# Patient Record
Sex: Male | Born: 1955 | Race: White | Hispanic: No | Marital: Married | Smoking: Current some day smoker
Health system: Southern US, Community
[De-identification: ages and names within clinical notes are randomized; demographics above are authoritative.]

## PROBLEM LIST (undated history)

## (undated) DIAGNOSIS — E669 Obesity, unspecified: Secondary | ICD-10-CM

## (undated) DIAGNOSIS — E785 Hyperlipidemia, unspecified: Secondary | ICD-10-CM

## (undated) DIAGNOSIS — Z8249 Family history of ischemic heart disease and other diseases of the circulatory system: Secondary | ICD-10-CM

## (undated) DIAGNOSIS — M1990 Unspecified osteoarthritis, unspecified site: Secondary | ICD-10-CM

## (undated) DIAGNOSIS — I10 Essential (primary) hypertension: Secondary | ICD-10-CM

## (undated) HISTORY — DX: Obesity, unspecified: E66.9

## (undated) HISTORY — DX: Family history of ischemic heart disease and other diseases of the circulatory system: Z82.49

## (undated) HISTORY — PX: COLONOSCOPY: SHX174

## (undated) HISTORY — DX: Essential (primary) hypertension: I10

## (undated) HISTORY — DX: Unspecified osteoarthritis, unspecified site: M19.90

## (undated) HISTORY — DX: Hyperlipidemia, unspecified: E78.5

## (undated) HISTORY — PX: KNEE SURGERY: SHX244

---

## 1988-09-13 HISTORY — PX: VASECTOMY: SHX75

## 2002-11-30 ENCOUNTER — Ambulatory Visit (HOSPITAL_COMMUNITY): Admission: RE | Admit: 2002-11-30 | Discharge: 2002-11-30 | Payer: Self-pay | Admitting: Family Medicine

## 2002-11-30 ENCOUNTER — Encounter: Payer: Self-pay | Admitting: Family Medicine

## 2002-12-13 ENCOUNTER — Ambulatory Visit (HOSPITAL_COMMUNITY): Admission: RE | Admit: 2002-12-13 | Discharge: 2002-12-13 | Payer: Self-pay | Admitting: Family Medicine

## 2002-12-13 ENCOUNTER — Encounter: Payer: Self-pay | Admitting: Family Medicine

## 2002-12-24 ENCOUNTER — Encounter: Payer: Self-pay | Admitting: Family Medicine

## 2002-12-27 ENCOUNTER — Ambulatory Visit (HOSPITAL_COMMUNITY): Admission: RE | Admit: 2002-12-27 | Discharge: 2002-12-27 | Payer: Self-pay | Admitting: Family Medicine

## 2003-12-19 ENCOUNTER — Ambulatory Visit (HOSPITAL_COMMUNITY): Admission: RE | Admit: 2003-12-19 | Discharge: 2003-12-19 | Payer: Self-pay | Admitting: Family Medicine

## 2004-01-29 ENCOUNTER — Ambulatory Visit (HOSPITAL_COMMUNITY): Admission: RE | Admit: 2004-01-29 | Discharge: 2004-01-29 | Payer: Self-pay | Admitting: Internal Medicine

## 2006-04-25 ENCOUNTER — Ambulatory Visit (HOSPITAL_COMMUNITY): Admission: RE | Admit: 2006-04-25 | Discharge: 2006-04-25 | Payer: Self-pay | Admitting: Family Medicine

## 2006-06-16 ENCOUNTER — Ambulatory Visit: Payer: Self-pay | Admitting: Orthopedic Surgery

## 2006-07-14 ENCOUNTER — Ambulatory Visit: Payer: Self-pay | Admitting: Orthopedic Surgery

## 2007-02-27 ENCOUNTER — Ambulatory Visit: Payer: Self-pay | Admitting: Orthopedic Surgery

## 2007-03-10 ENCOUNTER — Ambulatory Visit: Payer: Self-pay | Admitting: Orthopedic Surgery

## 2007-03-10 ENCOUNTER — Ambulatory Visit (HOSPITAL_COMMUNITY): Admission: RE | Admit: 2007-03-10 | Discharge: 2007-03-10 | Payer: Self-pay | Admitting: Orthopedic Surgery

## 2007-03-13 ENCOUNTER — Ambulatory Visit: Payer: Self-pay | Admitting: Orthopedic Surgery

## 2007-03-13 ENCOUNTER — Encounter (HOSPITAL_COMMUNITY): Admission: RE | Admit: 2007-03-13 | Discharge: 2007-04-12 | Payer: Self-pay | Admitting: Orthopedic Surgery

## 2007-03-30 ENCOUNTER — Ambulatory Visit: Payer: Self-pay | Admitting: Orthopedic Surgery

## 2007-04-26 ENCOUNTER — Ambulatory Visit: Payer: Self-pay | Admitting: Orthopedic Surgery

## 2007-08-14 ENCOUNTER — Ambulatory Visit: Payer: Self-pay | Admitting: Orthopedic Surgery

## 2007-08-14 DIAGNOSIS — M1710 Unilateral primary osteoarthritis, unspecified knee: Secondary | ICD-10-CM | POA: Insufficient documentation

## 2007-08-14 DIAGNOSIS — IMO0002 Reserved for concepts with insufficient information to code with codable children: Secondary | ICD-10-CM | POA: Insufficient documentation

## 2007-09-04 ENCOUNTER — Telehealth: Payer: Self-pay | Admitting: Orthopedic Surgery

## 2007-09-14 HISTORY — PX: OTHER SURGICAL HISTORY: SHX169

## 2007-09-28 ENCOUNTER — Telehealth: Payer: Self-pay | Admitting: Orthopedic Surgery

## 2007-09-28 ENCOUNTER — Encounter: Payer: Self-pay | Admitting: Orthopedic Surgery

## 2007-10-01 ENCOUNTER — Encounter: Payer: Self-pay | Admitting: Orthopedic Surgery

## 2007-10-10 ENCOUNTER — Encounter: Payer: Self-pay | Admitting: Orthopedic Surgery

## 2007-10-23 IMAGING — CR DG CHEST 2V
2 series · 2 of 2 positions shown · non-contrast
Comparison: none

CLINICAL DATA: 51-year-old male, preop for knee replacement.  History of tobacco abuse. 
 CHEST - 2 VIEW:

[w chest pa]
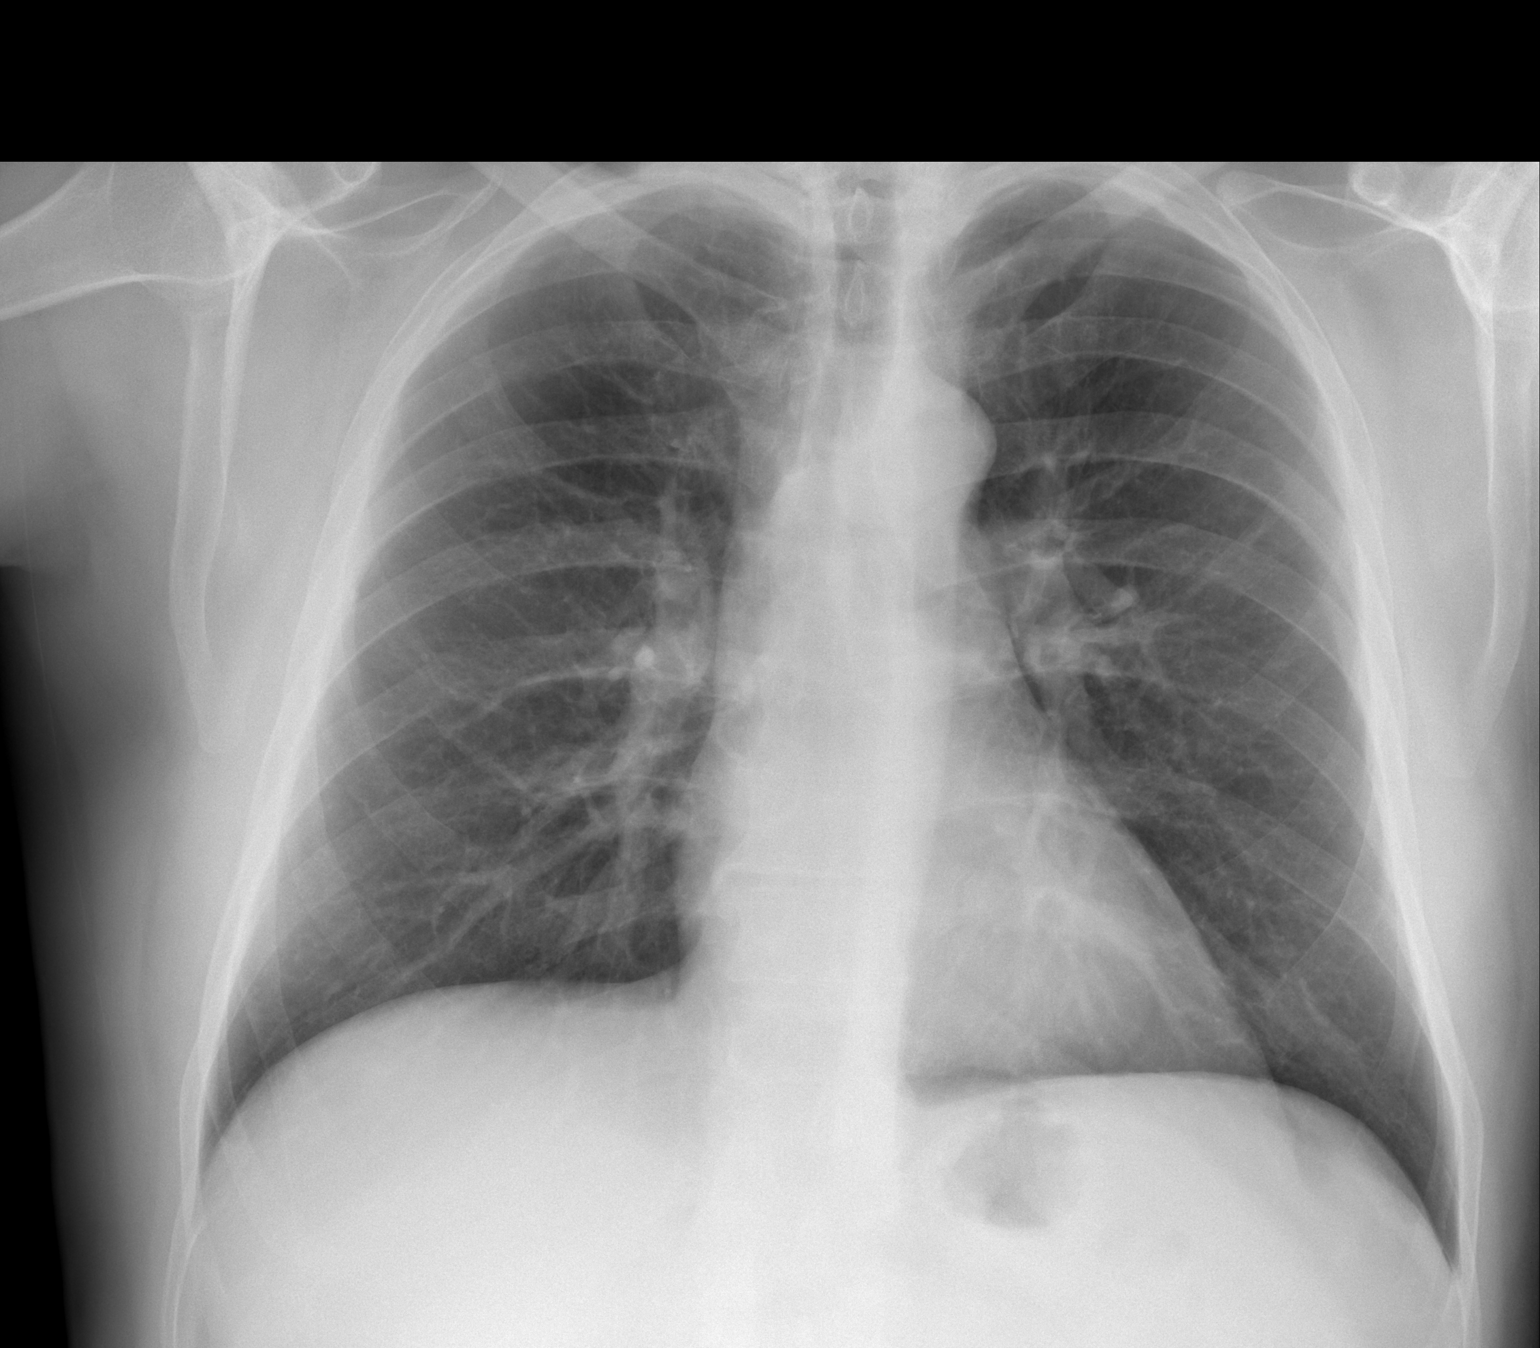

[w chest lat]
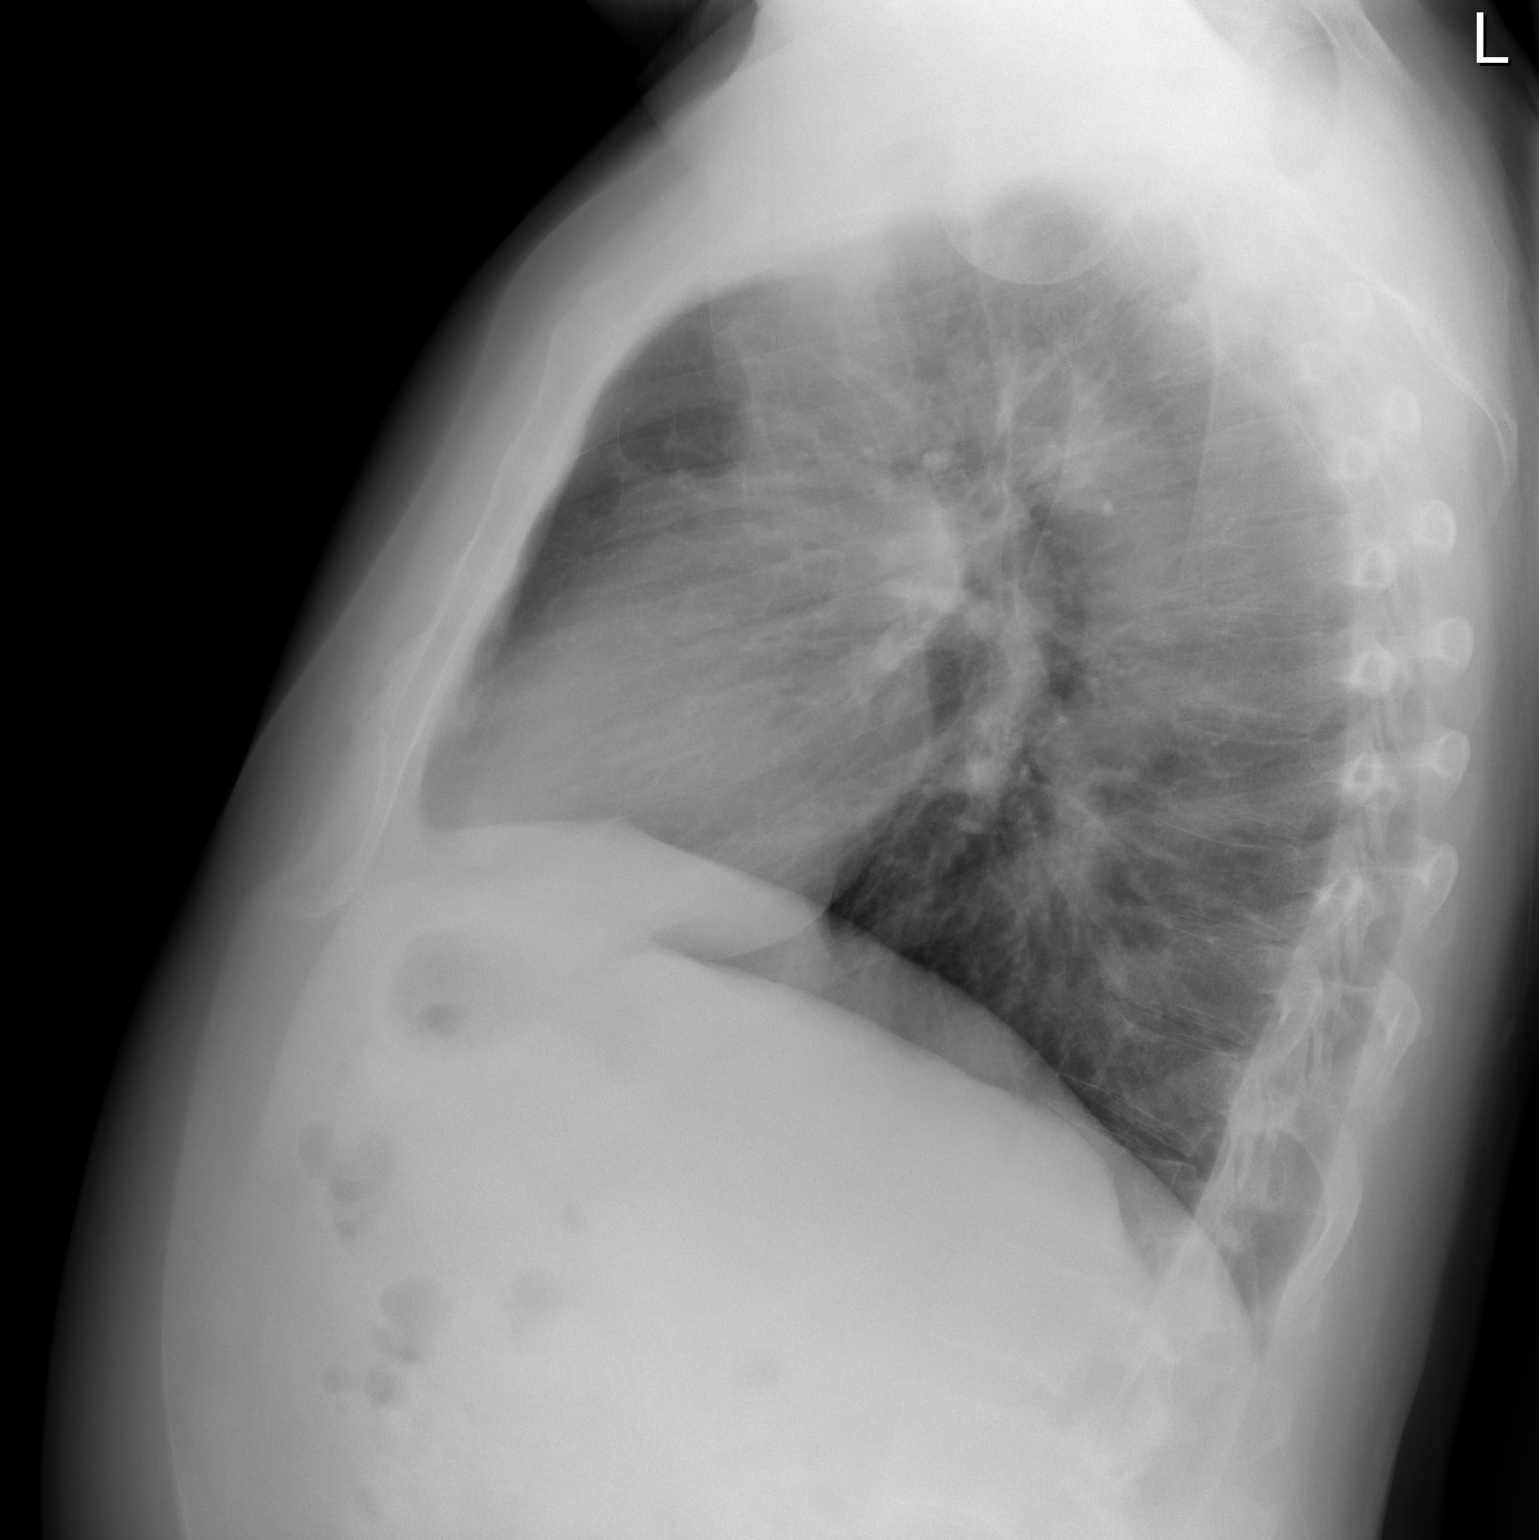

[2 of 2 positions shown; findings below may reference images not displayed]

FINDINGS: The cardiopericardial silhouette is within normal limits for size.  There is mild prominence of the pulmonary arteries, similar to the prior study.  There is slight expansion of the AP diameter of the chest.  Mild flattening of the hemidiaphragms are noted.  No focal air space disease is seen.
IMPRESSION: 1.  Mild changes of COPD.
 2.  No acute cardiopulmonary disease.

## 2007-10-31 ENCOUNTER — Observation Stay (HOSPITAL_COMMUNITY): Admission: RE | Admit: 2007-10-31 | Discharge: 2007-11-01 | Payer: Self-pay | Admitting: Orthopedic Surgery

## 2008-05-13 ENCOUNTER — Ambulatory Visit: Payer: Self-pay | Admitting: Orthopedic Surgery

## 2008-05-13 DIAGNOSIS — M7710 Lateral epicondylitis, unspecified elbow: Secondary | ICD-10-CM | POA: Insufficient documentation

## 2008-06-26 ENCOUNTER — Ambulatory Visit: Payer: Self-pay | Admitting: Orthopedic Surgery

## 2008-07-16 ENCOUNTER — Ambulatory Visit (HOSPITAL_COMMUNITY): Admission: RE | Admit: 2008-07-16 | Discharge: 2008-07-17 | Payer: Self-pay | Admitting: Orthopedic Surgery

## 2009-11-05 ENCOUNTER — Ambulatory Visit (HOSPITAL_COMMUNITY): Admission: RE | Admit: 2009-11-05 | Discharge: 2009-11-05 | Payer: Self-pay | Admitting: Family Medicine

## 2009-11-05 IMAGING — CR DG CHEST 2V
2 series · 2 of 2 positions shown · non-contrast
Comparison: PA and lateral chest 10/23/2007

CLINICAL DATA: Physical exam.  Tobacco abuse.

CHEST - 2 VIEW

[view not recorded (1 of 2)]
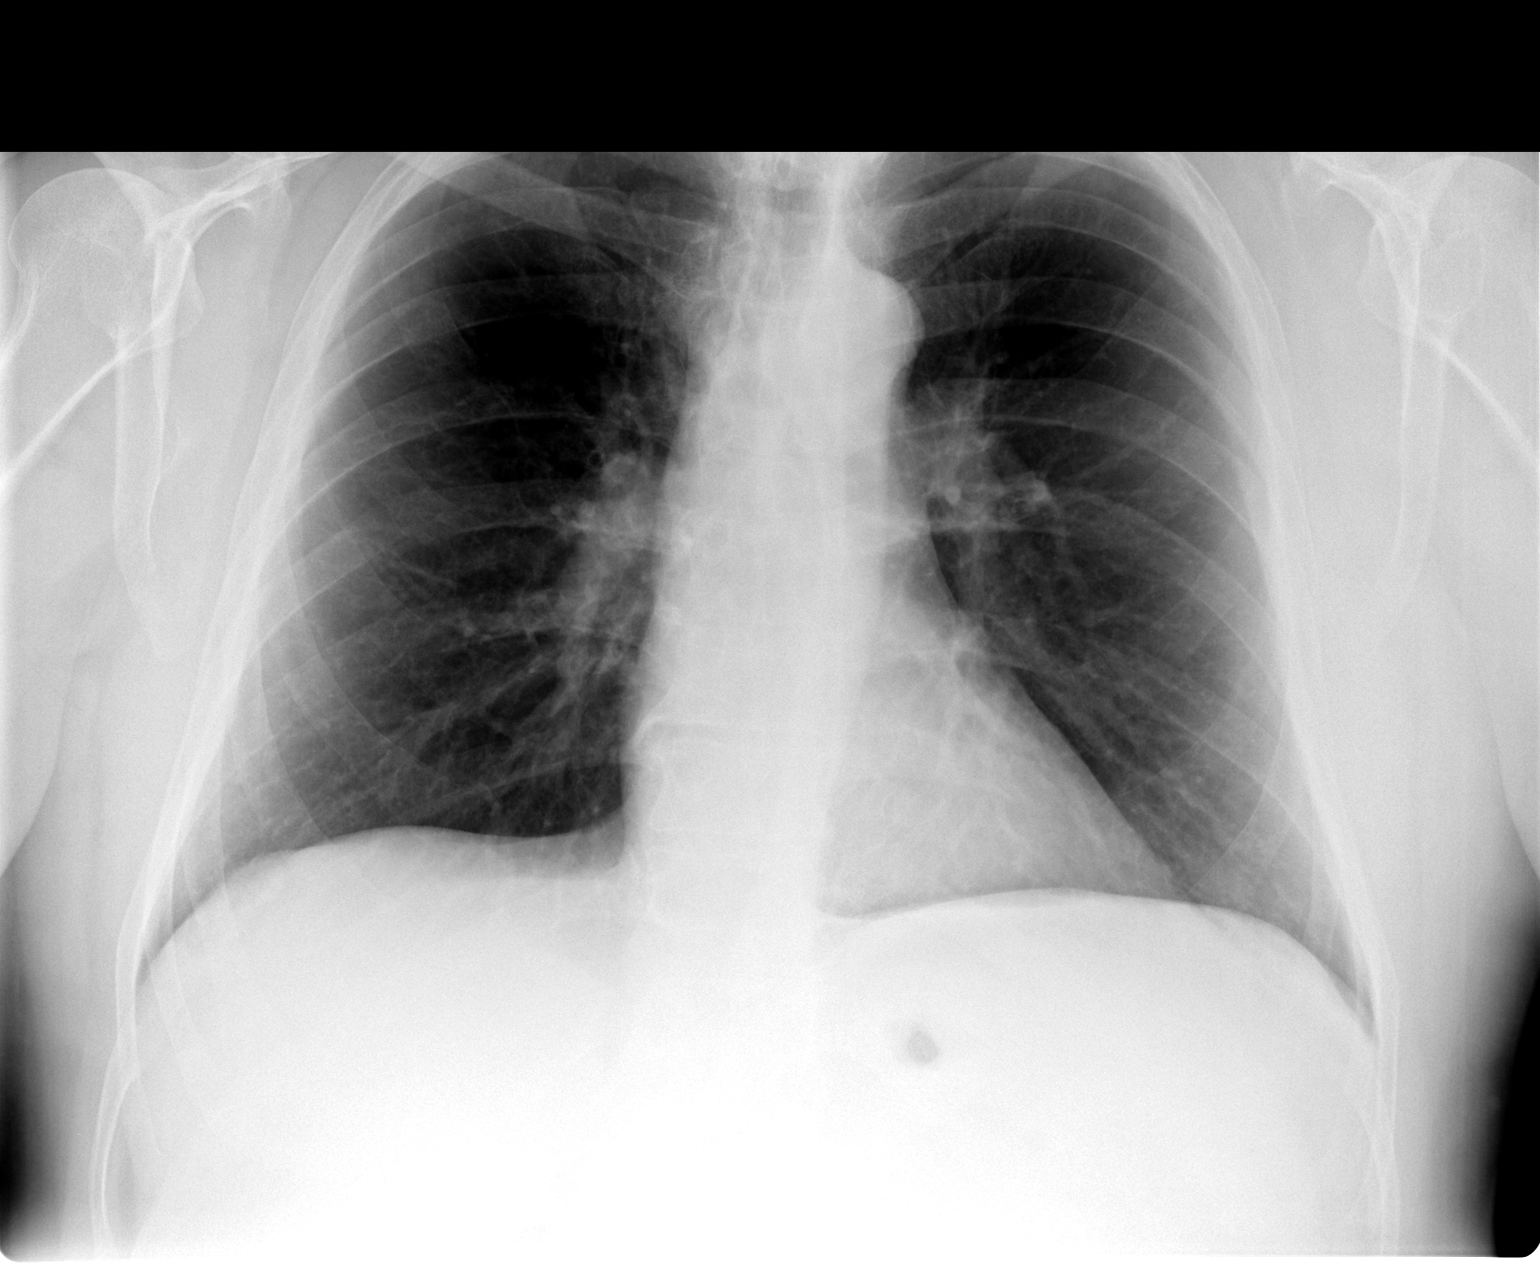

[view not recorded (2 of 2)]
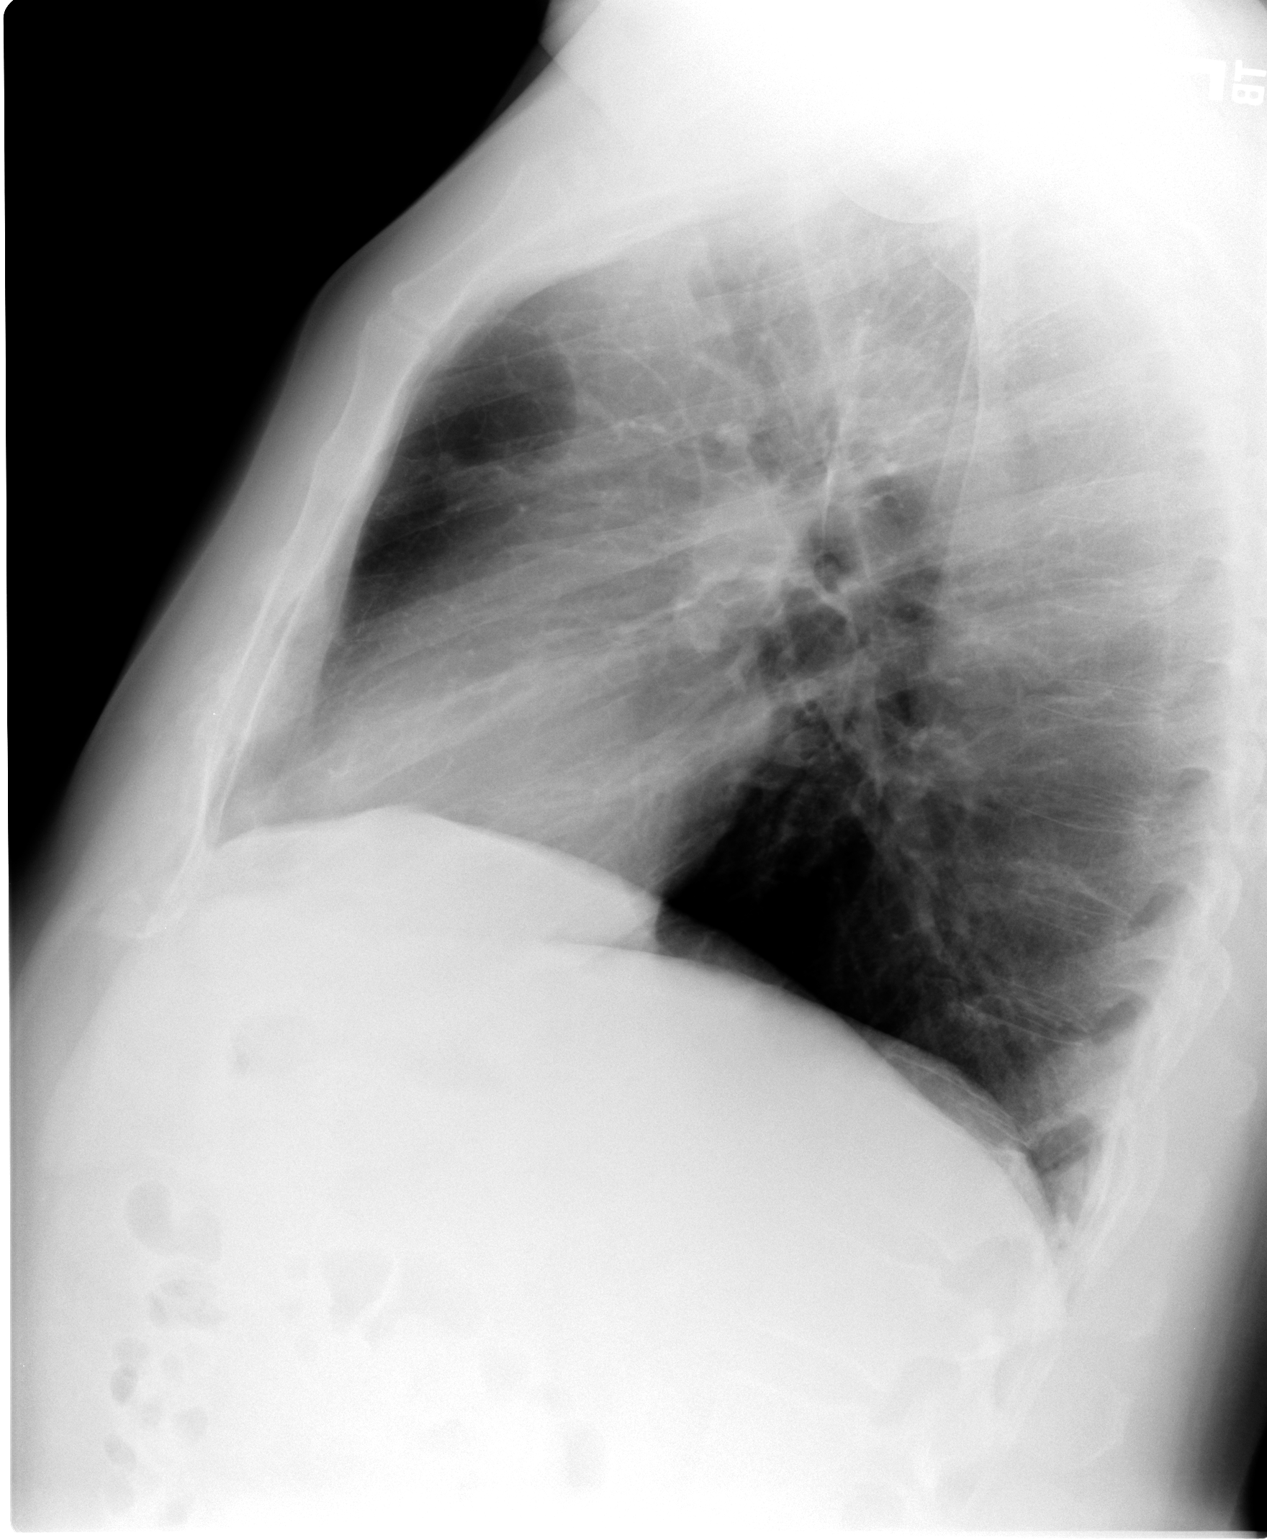

[2 of 2 positions shown; findings below may reference images not displayed]

FINDINGS: The lungs are clear.  Heart size is normal.  No pleural
effusion.  Pulmonary hyperexpansion noted.
IMPRESSION: No acute finding.  Stable compared prior exam.

## 2010-11-10 ENCOUNTER — Ambulatory Visit (HOSPITAL_COMMUNITY)
Admission: RE | Admit: 2010-11-10 | Discharge: 2010-11-10 | Disposition: A | Discharge status: Home / Self Care | Payer: 59 | Source: Ambulatory Visit | Attending: Family Medicine | Admitting: Family Medicine

## 2010-11-10 ENCOUNTER — Other Ambulatory Visit (HOSPITAL_COMMUNITY): Payer: Self-pay | Admitting: Family Medicine

## 2010-11-10 DIAGNOSIS — Z87891 Personal history of nicotine dependence: Secondary | ICD-10-CM

## 2010-11-10 DIAGNOSIS — R0600 Dyspnea, unspecified: Secondary | ICD-10-CM

## 2010-11-10 DIAGNOSIS — R0609 Other forms of dyspnea: Secondary | ICD-10-CM | POA: Insufficient documentation

## 2010-11-10 DIAGNOSIS — F17200 Nicotine dependence, unspecified, uncomplicated: Secondary | ICD-10-CM | POA: Insufficient documentation

## 2010-11-10 DIAGNOSIS — R0989 Other specified symptoms and signs involving the circulatory and respiratory systems: Secondary | ICD-10-CM | POA: Insufficient documentation

## 2010-11-10 IMAGING — CR DG CHEST 2V
2 series · 2 of 2 positions shown · non-contrast
Comparison: 11/05/2009

CLINICAL DATA: Smoker, dyspnea on exertion, shortness of breath

CHEST - 2 VIEW

[view not recorded (1 of 2)]
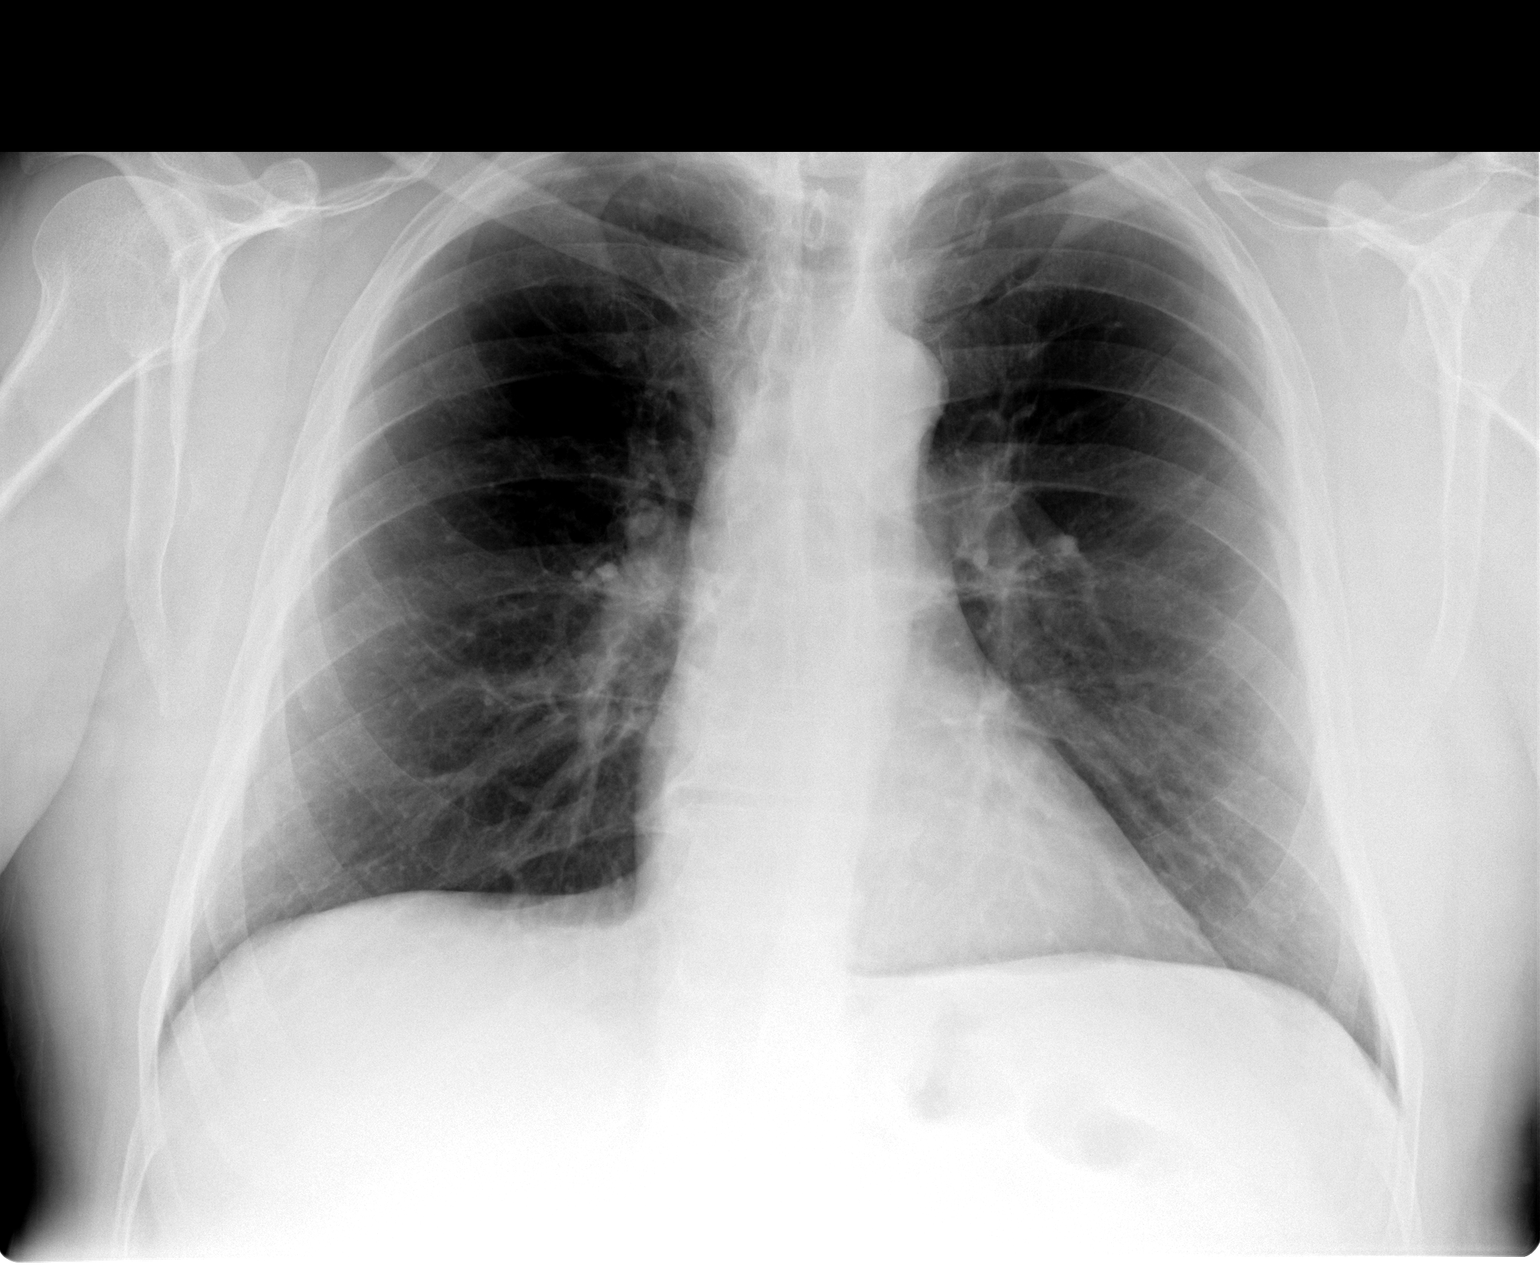

[view not recorded (2 of 2)]
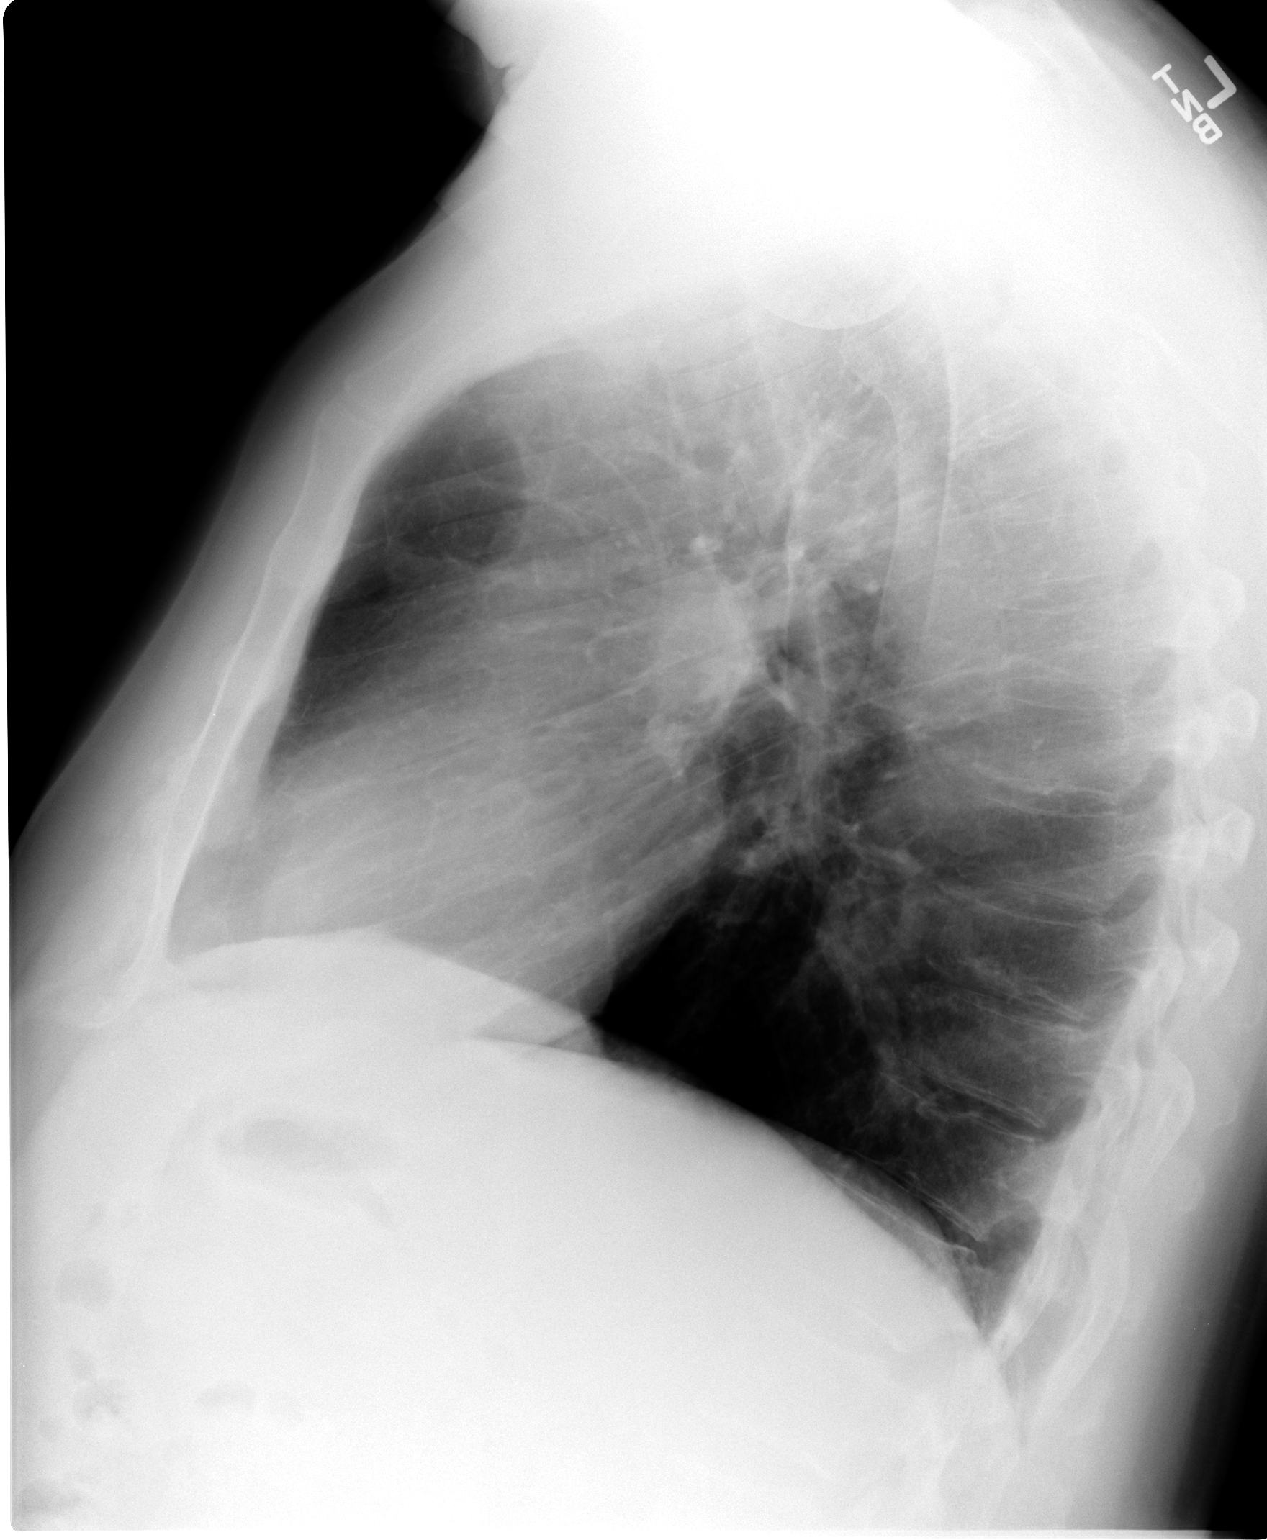

[2 of 2 positions shown; findings below may reference images not displayed]

FINDINGS: Normal heart size, mediastinal contours, and pulmonary vascularity.
Mild emphysematous changes with minimal chronic peribronchial
thickening.
No infiltrate, pleural effusion or pneumothorax.
No acute osseous findings.
IMPRESSION: Emphysematous changes.
No acute abnormalities.

## 2011-01-26 NOTE — Op Note (Signed)
[name], [name]              ACCOUNT NO.:  [id number]   MEDICAL RECORD NO.:  [id number]          PATIENT TYPE:  INP   LOCATION:  0007                         FACILITY:  [facility]   PHYSICIAN:  Madlyn Frankel. Charlann Boxer, M.D.  DATE OF BIRTH:  11/27/1955   DATE OF PROCEDURE:  07/16/2008  DATE OF DISCHARGE:                               OPERATIVE REPORT   PREOPERATIVE DIAGNOSIS:  Left knee medial compartment osteoarthritis,  history of right partial knee replacement.   POSTOPERATIVE DIAGNOSIS:  Left knee medial compartment osteoarthritis,  history of right partial knee replacement.   PROCEDURE:  Left unicompartmental or partial knee replacement using a  Biomet Oxford knee system size large femur, size D medial left tibial  tray, size 4 insert to match the large femur.   SURGEON:  Madlyn Frankel. Charlann Boxer, M.D.   ASSISTANT:  Yetta Glassman. Loreta Ave, PA-C   ANESTHESIA:  Spinal.   SPECIMENS:  None.   COMPLICATIONS:  None.   DRAINS:  Times one.   TOURNIQUET TIME:  45 minutes at 250 mmHg.   INDICATIONS OF PROCEDURE:  [name] is a 55 year old gentleman with  bilateral knee osteoarthritis predominantly with complaints medially.  He successfully underwent a right partial knee replacement and was very  pleased with that result.  He had progressive discomfort of his left  knee worsening over the past couple months with a desire at this point  to proceed with left partial knee replacement.  Risks, benefits,  hospital stay were all reviewed and consent was obtained.   PROCEDURE IN DETAIL:  The patient was brought to the operative theater.  Once adequate anesthesia was established, preoperative antibiotics  administered, the patient was positioned supine with the thigh  tourniquet placed.  The leg was then placed into the Biomet leg holder  to allow for 120 degrees of flexion.   At this point we pre scrubbed and prepped and draped the left knee para  midline.  The leg was exsanguinated.  A para midline  incision was made  from the patella to the tubercle.  Extensor mechanism was defined.  A  median arthrotomy was created to allow for patella subluxation.  Following initial exposure and debridement of synovium and fat pad  osteophytes were debrided off the medial distal femur all the way to the  anterior aspect as well as into the notch.   With this taken care of the tibia was addressed first.  Using  extramedullary guide I resected bone with a reciprocating and then  oscillating saw on the medial side of the knee.  At this point I checked  the cut surface and confirmed it was a size D again as we had done on  the contralateral knee.  The 4 feeler gauge within this medial  compartment nicely.   At this point the intramedullary canal on the femur was opened a  centimeter above the medial aspect at the notch.  Intramedullary rod  passed by hand.   The posterior cutting block guide holes were then positioned with a 3  feeler gauge in place.  Once I had confirmed it was in  the medial third  of the femur and parallel to the intramedullary rod drill holes were  made.  The posterior cutting block was then placed and the posterior cut  made.   At this point the zero spigot was placed and the distal femur milled.  A  trial reduction is now carried out with a large femur and D tibia and  the 4 insert which got in with the knee in flexion.  However, in  extension I was unable to really get the size 1 feeler gauge in place.  Given this I chose to use a 4 spigot.  The distal femur was milled  again, bone was removed on the medial and lateral aspects of the medial  femoral condyle as well as at the area I had just reamed out centrally.  I retrialed at this point with a large femur and D tibia.  At this point  the size 4 feeler gauge felt nicely from extension and flexion.   At this point trial components were removed, tibial preparation was  carried out with the tibial tray held in position and  pinned.  The slot  for the keel was then sought out with the oscillating saw.   The trial reduction was now carried out with the keeled D medial  component, the large femur and the 4 lollipop poly.  In 90 degrees of  flexion and then at 20 degrees of flexion there was a millimeter or 2  play of the poly with tension.   Given all these parameters I removed all components, drilled sclerotic  bone with the drill, irrigated the wounds and prepared cement as a  single batch single cementing technique.  Final components were opened  and the cement mixed.  Tibial component was cemented first.  The knee  was brought to 45 degrees of flexion with a trial femoral component  placed and held in position for approximately 3-4 minutes.  Excessive  cement was removed.  Following this the femoral component was cemented  in position under the same technique, removing excessive cement and  holding the knee at 45 degrees of flexion.  Once the cement had fully  cured on the back table excessive cement was removed throughout the back  of the knee.  Once I was satisfied I was unable to visualize any cement  to the soft tissues or around the prosthetic borders.  I placed the  final 4, inserted the match for the large femoral component.  We  reirrigated the knee.  Tourniquet was let down at 45 minutes.  Placed a  medium Hemovac drain deep.  We then reapproximated extensor mechanism  with the knee in flexion using #1 Vicryl.  The remaining wound was  closed with 2-0 Vicryl and running 4-0 Monocryl.  The knee was cleaned,  dried and dressed sterilely with a sterile bulky wrap.  He was brought  to the recovery room in stable condition tolerating the procedure well.      Madlyn Frankel Charlann Boxer, M.D.  Electronically Signed     MDO/MEDQ  D:  07/16/2008  T:  07/17/2008  Job:  [id number]

## 2011-01-26 NOTE — H&P (Signed)
NAMESAEL, FURCHES              ACCOUNT NO.:  [id number]   MEDICAL RECORD NO.:  [id number]         PATIENT TYPE:  LINP   LOCATION:                               FACILITY:  [facility]   PHYSICIAN:  Madlyn Frankel. Charlann Boxer, M.D.  DATE OF BIRTH:  05/15/1956   DATE OF ADMISSION:  10/31/2007  DATE OF DISCHARGE:                              HISTORY & PHYSICAL   ATTENDING PHYSICIAN:  Madlyn Frankel. Charlann Boxer, M.D.   PROCEDURE:  Right unicondylar knee replacement.   CHIEF COMPLAINT:  Right medial knee pain.   HISTORY OF PRESENT ILLNESS:  A 55 year old male with a history of right  medial knee pain secondary to medial compartment osteoarthritis.  It has  been refractory to all conservative treatment including oral anti-  inflammatories, cortisone injections.  His quality of life has been  diminished.  He has been presurgically cleared for surgery.   PAST MEDICAL HISTORY:  Significant for:  1. Osteoarthritis.  2. Hypertension.   PAST SURGICAL HISTORY:  Vasectomy and arthroscopic surgery right knee.   FAMILY HISTORY:  Diabetes, cancer.   SOCIAL HISTORY:  The patient is married.  Primary caregiver after  surgery will be his wife, Vernona Rieger.   DRUG ALLERGIES:  No known drug allergies.   MEDICATIONS:  1. Benicar 20 mg one p.o. daily.  2. Vicodin p.r.n.  3. Ibuprofen p.r.n.   REVIEW OF SYSTEMS:  See history of present illness.   PHYSICAL EXAMINATION:  VITAL SIGNS:  Pulse 64, respirations 18, blood  pressure 140/84.  GENERAL:  Awake, alert and oriented, well-developed, well-nourished, no  acute distress.  NECK:  Supple.  No carotid bruits.  CHEST/LUNGS:  Clear to auscultation bilaterally.  BREASTS:  Deferred.  HEART:  Regular rate and rhythm.  S1-S2 distinct.  ABDOMEN:  Soft, nontender, bowel sounds present.  GENITOURINARY:  Deferred.  EXTREMITIES:  Right knee with medial sided tenderness.  He does have  full extension with flexion back to 130 degrees.  SKIN:  Previous arthroscopic scars.  No sign of  cellulitis or infection.  NEUROLOGIC:  Intact distal sensibilities.   Labs, EKG, chest x-ray all pending presurgical testing.   IMPRESSION:  Right knee medial osteoarthritis.   PLAN OF ACTION:  A right unicondylar knee replacement surgery by  surgeon, Dr. Durene Romans, at [facility] on October 31, 2007.  Risks and complications were discussed.  Postoperative medications were  provided at time of history and physical, including Lovenox Robaxin,  iron, aspirin, Colace and MiraLax.  Pain medicine will be provided at  time of surgery.     ______________________________  Yetta Glassman Loreta Ave, [place]      Madlyn Frankel. Charlann Boxer, M.D.  Electronically Signed    BLM/MEDQ  D:  10/27/2007  T:  10/28/2007  Job:  [id number]   cc:   Patrica Duel, M.D.  Fax: [phone]

## 2011-01-26 NOTE — H&P (Signed)
[name], [name]              ACCOUNT NO.:  [id number]   MEDICAL RECORD NO.:  [id number]          PATIENT TYPE:  INP   LOCATION:                               FACILITY:  [facility]   PHYSICIAN:  Madlyn Frankel. Charlann Boxer, M.D.  DATE OF BIRTH:  1955/10/26   DATE OF ADMISSION:  07/16/2008  DATE OF DISCHARGE:                              HISTORY & PHYSICAL   PROCEDURE:  Left unicondylar knee replacement of the medial compartment.   CHIEF COMPLAINT:  Left knee pain.   HISTORY OF PRESENT ILLNESS:  A 55 year old male with a history of left  knee pain secondary to medial compartment osteoarthritis.  It has been  refractory to conservative treatment.  Has a history of right partial  knee replacement and did great.   PRIMARY CARE PHYSICIAN:  Dr. Patrica Duel.   PAST MEDICAL HISTORY:  1. Significant for osteoarthritis.  2. Hypertension.   PAST SURGICAL HISTORY:  1. Includes right partial knee replacement.  2. Arthroscopic surgery on the right knee as well.   FAMILY HISTORY:  Heart disease.   SOCIAL HISTORY:  Married.  Primary caregiver will be in the home after  surgery.   DRUG ALLERGIES:  No known drug allergies.   MEDICATIONS:  1. Include Benicar 20 mg 1 p.o. daily.  2. Celebrex 200 mg 1 p.o. b.i.d. x2 weeks after surgery.   REVIEW OF SYSTEMS:  See HPI.   PHYSICAL EXAMINATION:  VITAL SIGNS:  Pulse 72, respirations 18, blood  pressure 124/78.  GENERAL:  Awake, alert, and oriented.  Well developed, well nourished,  in no acute distress.  NECK:  Supple.  No carotid bruits.  CHEST:  Lungs clear to auscultation bilaterally.  BREASTS:  Deferred.  HEART:  Regular rate and rhythm.  S1, S2 distinct.  ABDOMEN:  Soft, nontender, nondistended.  Bowel sounds present.  GENITOURINARY:  Deferred.  EXTREMITIES:  Left medial knee osteoarthritis with medial side  tenderness.  SKIN:  No cellulitis.  NEUROLOGIC:  Intact distal sensibilities.   LABORATORY DATA:  Labs, EKG, chest x-ray all pending.   Presurgical  testing.   IMPRESSION:  Left medial compartment osteoarthritis of the left knee.   PLAN OF ACTION:  Left unicondylar knee replacement [facility]  July 16, 2008 by Dr. Durene Romans.  Risks and complications were  discussed.   POSTOPERATIVE MEDICATIONS:  Include Lovenox, Robaxin, iron, aspirin,  Colace, Miralax, provided, as well as Celebrex will be utilized  perioperatively.     ______________________________  Yetta Glassman Loreta Ave, [place]      Madlyn Frankel. Charlann Boxer, M.D.  Electronically Signed    BLM/MEDQ  D:  07/10/2008  T:  07/10/2008  Job:  [id number]   cc:   Patrica Duel, M.D.  Fax: [phone]

## 2011-01-26 NOTE — H&P (Signed)
[name], [name]              ACCOUNT NO.:  [id number]   MEDICAL RECORD NO.:  [id number]          PATIENT TYPE:  AMB   LOCATION:                                FACILITY:  [facility]   PHYSICIAN:  Vickki Hearing, M.D.DATE OF BIRTH:  26-Sep-1955   DATE OF ADMISSION:  DATE OF DISCHARGE:  LH                              HISTORY & PHYSICAL   CHIEF COMPLAINT:  Right knee pain.   HISTORY:  This is a 55 year old male. He has had pain in his right knee  for 6 months, no injury.  No previous problems.  He reports pain in the  medial aspect of the right knee, difficulty getting in and out of a  chair. He has tried ibuprofen, Celebrex, Tylenol without any relief.  He  specifically indicates that he has pain when he twists or turns on the  knee and that it is all medial. He comes in for evaluation.   I have seen him in the past for other orthopedic related problems for  his heel. He has recently been seen by Dr. Nobie Putnam for his physical. He  was put on some blood pressure medicine, Benicar 20 mg a day.  That  seems to have controlled his blood pressure. He has a negative review of  systems except for joint problems.  He has no major medical problems  other than hypertension.  He has had a vasectomy. His  medication was  listed. He is also given some Vicodin ES for pain during the interval  until his surgery. He has a family history of arthritis and diabetes.   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  He does not smoke since  February 2007.  He has a couple beers a day. He has a Theme park manager, he is an Airline pilot.   VITAL SIGNS:  He weighs approximately 210 pounds, pulse 66, respiratory  rate is 18.  GENERAL: He is well-developed and nourished, grooming and hygiene are  normal.  CARDIOVASCULAR:  Shows normal pulse and perfusion, no swelling.  LYMPH:  Negative for lymphadenopathy and lymphangitis.  SKIN:  Normal x4 extremities.  Skin normal head and neck.  NEURO:  Normal  sensation, reflexes, coordination. He is awake, alert and  oriented x3.  MUSCULOSKELETAL:  Upper extremities normal range of motion, strength,  stability, alignment. Right knee medial joint line tenderness, small  joint effusion, full strength and range of motion but painful range of  motion 120-135. Painful hyperextension test, painful McMurray's sign,  ligaments stable.   X-rays taken today show medial compartment compromise.   IMPRESSION:  1. Torn medial meniscus of the right knee.  2. Osteoarthritis medial compartment, right knee.  3. Pain right knee.   The informed consent process was completed. See chart records from the  office. We will do a right knee arthroscopy.  Our plan is to evaluate  his articular cartilage of his other part of his knee to see if there is  any other arthritis. If he turns out to not have a cartilage tear, he  would be a candidate for a unicompartment  replacement by Dr. Charlann Boxer in  [place], but I am very sure that he does.      Vickki Hearing, M.D.  Electronically Signed     SEH/MEDQ  D:  02/27/2007  T:  02/28/2007  Job:  [id number]

## 2011-01-26 NOTE — Op Note (Signed)
[name], [name]              ACCOUNT NO.:  [id number]   MEDICAL RECORD NO.:  [id number]          PATIENT TYPE:  INP   LOCATION:  1613                         FACILITY:  [facility]   PHYSICIAN:  Madlyn Frankel. Charlann Boxer, M.D.  DATE OF BIRTH:  Aug 26, 1956   DATE OF PROCEDURE:  10/31/2007  DATE OF DISCHARGE:                               OPERATIVE REPORT   PREOPERATIVE DIAGNOSIS:  Right knee medial compartment osteoarthritis.   POSTOPERATIVE DIAGNOSIS:  Right knee medial compartment osteoarthritis.   PROCEDURE:  Right unicompartmental partial knee replacement.  Using a  Biomet Oxford knee system, size Large right femur, a D medial right  tibia, and a 5-mm insert.   SURGEON:  Madlyn Frankel. Charlann Boxer, M.D.   ASSISTANT:  Yetta Glassman. Mann, PA   ANESTHESIA:  Spinal.   COMPLICATIONS:  None.   TOURNIQUET TIME:  52 minutes at 250 mmHg.   BLOOD LOSS:  100 mL.   DRAINS:  One.   INDICATIONS FOR THE PROCEDURE:  [name] is a pleasant 55 year old  gentleman who is referred down for evaluation of partial knee  replacement.  He had failed conservative measures of arthroscopic  surgeries and injections, and wished to proceed with a more definitive  procedure.  I discussed the options of partial versus total knee  replacement, and based on his activity level, age and radiographs, he  was a candidate for partial knee replacement.  He wished to proceed that  way.  The risks and benefits were discussed.  Consent obtained.   PROCEDURE IN DETAIL:  The patient was brought to the operative theater.  Once adequate anesthesia and preoperative antibiotics with Ancef were  administered, the patient was positioned supine.  A proximal thigh  tourniquet was placed and the leg was positioned in a leg holder, with  it abducted and flexed to allow for 120 degrees of flexion of the knee  joint.  The knee was then prescrubbed and prepped and draped in a  sterile fashion; and then draped over the leg holder.  The leg was  exsanguinated, the tourniquet elevated to 250 mmHg.  A para midline  incision was made, followed by limited medial arthrotomy.  Fat pad and  synovium were debrided to allow for exposure.  Following initial  exposure, I was able to remove osteophytes along the medial, distal and  anterior femur.  Following this initial debridement, I placed an  extramedullary guide and resected bone off the proximal tibia in a  perpendicular plane using extramedullary device, making sure that I  remained parallel to the tibia in both planes.  Following this cut, I  excised the cut surface to be a size D for the medial side.  With this D  in place, I was able to pass the #4 feeler gauge easily at 90 degrees of  flexion, with a good finger pull; indicating that the cut was adequate.  At this point I removed the extramedullary device.  A hand awl was then  used to create a hole into the femoral canal 1 cm above the insertion of  the PCL.  I then passed the  intramedullary rod for the femur.  At this  point with a size D tibial tray in place and the #3 feeler gauge, I  placed the femoral cutting jig against it.  Once I had it in the correct  orientation and all planes per technique, I drilled it into position and  created the holes for the posterior cutting guide.  At this point all  devices were removed, and the posterior cutting jig placed onto the  distal femur.  The posterior femur cut was then made without difficulty.  This allowed me now to remove the remaining meniscus out of the medial  aspect of the knee.   At this point, a trial reduction was carried out.  I placed a 0 spigot  and then milled off the distal femur.  At this point I placed a trial  Large femur and D tibia.  At this point, I was happy with the 4 or 5 in  90 degrees of flexion; more leading towards the 5.  In extension I was  able to get the #2 feeler block at 20 degrees of extension.  At this  point used a #3 spigot, placed it on the  distal femur and then milled.  I removed excessive bone using osteotome and rongeur.  At this point a  retrial with a Large femur, D tibia, and a 5 insert; and was happy with  flexion extension balancing from 20 degrees extension to 90 degrees of  flexion without retractors in place.  At this point, all trials were  removed.  The final preparation for the tibia was carried out, holding  the size D tibia in position and then pinned it in spot and then used a  reciprocating saw to create the trough for the keel.  I then cleaned out  the trough for the instruments provided.  A trial reduction was carried  out with the final components, again with a D medial tibia, Large femur,  and the standard trials with a size 5.  At 90 degrees of flexion without  retractors, I was able to have a millimeter or so of play with the  trial; in addition this was a similar finding at 20 degrees of  extension.  At this point, all trials were removed.  I drilled holes in  the distal femur sclerotic bone to allow for cemented and digitation.  The final components were opened and cement was mixed in 2 batches.  Once I irrigated the knee out, I injected the synovial capsule layer  with 0.25% Marcaine with epinephrine and 1 mL of Toradol.  The cement  was mixed first for the tibia.  The trial femur was placed, and then the  #5 feeler gauge was placed with the knee in 45 degrees of flexion.  After 5 minutes into this, the second batch of cement was mixed.  The  femur was then cemented into position with the same technique carried  out.  Excessive cement was removed.  Once I was satisfied there was no  remaining cement, I re-irrigated the wound.  The final 5 insert was  placed.  This snapped into place with a good sound and good pressure.   At this point, we re-irrigated the wound and placed a medium Hemovac out  laterally from the proximal aspect of the knee.  The extensor mechanism  was then reapproximated using #1  Vicryl with the knee in flexion.  The  remaining wound was closed 2-0 Vicryl and a running  4-0 Monocryl.  The  knee was cleaned, dried and dressed sterilely with a sterile bulky  dressing.  He was brought to the recovery room in stable condition,  having tolerated the procedure well.      Madlyn Frankel Charlann Boxer, M.D.  Electronically Signed     MDO/MEDQ  D:  10/31/2007  T:  11/01/2007  Job:  [id number]

## 2011-01-26 NOTE — Op Note (Signed)
[name]              ACCOUNT NO.:  [id number]   MEDICAL RECORD NO.:  [id number]          PATIENT TYPE:  AMB   LOCATION:  DAY                           FACILITY:  [facility]   PHYSICIAN:  Vickki Hearing, M.D.DATE OF BIRTH:  January 10, 1956   DATE OF PROCEDURE:  03/10/2007  DATE OF DISCHARGE:                               OPERATIVE REPORT   HISTORY AND INDICATIONS:  He is a 55 year old male.  He had pain in his  right knee for 6 months, no injury.  No previous problems.  Pain is  reported on the medial aspect of the knee.  Has difficulty getting out  of the chair.  He tried ibuprofen, Celebrex, and Tylenol and did not get  any relief and noticed specific symptoms when he turned to twisted or  pivoted on the knee.  He came in for evaluation.  I did not think that  he needed MRI because his physical findings were so prominent, and we  talked about possible procedures, including arthroscopy,  unicompartmental knee replacement, other nonoperative measures, and we  both decided on surgical treatment.   PREOPERATIVE DIAGNOSIS:  Osteoarthritis and torn medial meniscus of the  right knee.   POSTOPERATIVE DIAGNOSIS:  Osteoarthritis and torn medial meniscus of the  right knee.   PROCEDURE:  Arthroscopy, partial medial meniscectomy, and microfracture  of the medial femoral condyle, right knee.   SURGEON:  Vickki Hearing, M.D.   ASSISTANT:  None.   ANESTHESIA:  General.   OPERATIVE FINDINGS:  The patient had a torn posterior horn of the medial  meniscus.  He had grade 4 medial femoral condyle lesion.  He had a  fissure in his patella; otherwise, ACL and lateral compartments were  intact.  Ligaments were stable.   DESCRIPTION OF PROCEDURE:  The procedure was done as follow follows.  [name] was identified in the preoperative holding area.  He has  marked his right knee as the surgical site.  I countersigned it.  We  then updated his history and physical.  He was  taken to the operating  room for general anesthetic.  He had a sterile prep and drape.  Timeout  procedure completed.   Diagnostic arthroscopy performed through a lateral portal.  Medial  portal added, and a probe was placed in the portal.  The knee was noted  to have a torn medial meniscus.  This was treated with trimming with the  duckbill forceps and balancing with the motorized shaver and a 50-degree  ArthroCare wand.  We then addressed the medial femoral condyle lesion.  We reassessed it.  It was a grade 4.  It was a large lesion involving  most of his weightbearing surface.  We then performed a microfracture  using the chondral picks.  We removed any loose debris and cartilage in  the knee and then irrigated the knee and injected it with 10 mL  additional Marcaine for a total of 30 and then closed with the portals  with Steri-Strips.   Second procedure:  The patient asked for injection in his right heel  prior to  going back for surgery.  We consented him for this.  Prior to  his extubation, he was given 40 mg Depo-Medrol shot in the right heel  for plantar fasciitis.  Tolerated well.  No complications. Band-Aid  applied over the wound.  Technique used was sterile.   After dressings were applied and Cryo/Cuff, the patient was extubated  and taken to the recovery room in stable condition.   POSTOPERATIVE PLAN:  Nonweightbearing 3 weeks with crutches, immediate  range of motion, weightbearing in a brace for an additional 3 weeks,  i.e., weeks 4 through 6 and then progressive weightbearing as tolerated  and exercises and strengthening.      Vickki Hearing, M.D.  Electronically Signed     SEH/MEDQ  D:  03/10/2007  T:  03/10/2007  Job:  [id number]

## 2011-01-29 NOTE — Discharge Summary (Signed)
[name], [name]              ACCOUNT NO.:  [id number]   MEDICAL RECORD NO.:  [id number]          PATIENT TYPE:  OBV   LOCATION:  1613                         FACILITY:  [facility]   PHYSICIAN:  Madlyn Frankel. Charlann Boxer, M.D.  DATE OF BIRTH:  Mar 01, 1956   DATE OF ADMISSION:  10/31/2007  DATE OF DISCHARGE:  11/01/2007                               DISCHARGE SUMMARY   ADMISSION DIAGNOSES:  1. Osteoarthritis.  2. Hypertension.   DISCHARGE DIAGNOSES:  1. Osteoarthritis.  2. Hypertension.   HISTORY OF PRESENT ILLNESS:  This 55 year old male with a history of  right medial knee pain, secondary to medial compartment osteoarthritis.  Refractory to all conservative treatment.  He was presurgically assessed  for a partial knee replacement.   CONSULTATIONS:  None.   PROCEDURE:  A right partial unicondylar knee replacement with surgeon  Dr. Madlyn Frankel. Charlann Boxer and Radio producer L. Mann, P.A.-C.   LABORATORY DATA:  Admission CBC:  Hematocrit 38.6, at discharge 34.  White cells normal.  Coags negative and normal.  Routine chemistry  showed a little bit elevated glucose at 113 on admission.  All others  were normal.  At discharge the glucose was 114.  All others normal.  Urinalysis was negative.   RADIOLOGY:  A chest two views showed mild changes of chronic obstructive  pulmonary disease.  No acute cardiopulmonary disease.   Electrocardiogram:  No findings.   HOSPITAL COURSE:  The patient underwent a right partial knee replacement  on October 31, 2007.  He was admitted to the orthopedic floor.  He was  seen on November 01, 2007, doing well, asymptomatic and hemodynamically  stable.  The Hemovac was pulled.  He was neurovascularly intact.  Positive straight leg raise.  Dependent upon weightbearing with PT.  He  wanted to go home today.  The heparin lock to his IV.  He did great with  physical therapy and wanted to go home on the evening of November 01, 2007.  The pain was well-controlled.   DISPOSITION:  Discharged home with home health care and PT, stable, and  in an improved condition.   DIET:  Regular diet.   WOUND CARE:  Keep dry.   DISCHARGE INSTRUCTIONS:  Discharge physical therapy, weightbearing as  tolerated with a rolling walker, crutches, a single-point cane.   DISCHARGE MEDICATIONS:  1. Lovenox.  2. Robaxin.  3. Aspirin.  4. Iron.  5. Colace.  6. MiraLax.  7. Vicodin 5/325 mg, one or two p.o. q.4-6h. p.r.n. pain.  8. Benicar 20 mg, one p.o. daily.   FOLLOWUP:  Follow up with Dr. Charlann Boxer at [phone] in two weeks.     ______________________________  Yetta Glassman [address], [place]      Madlyn Frankel. Charlann Boxer, M.D.  Electronically Signed    BLM/MEDQ  D:  12/12/2007  T:  12/12/2007  Job:  [id number]   cc:   Patrica Duel, M.D.  Fax: [phone]

## 2011-01-29 NOTE — Consult Note (Signed)
NAME:  [name], [name]                          ACCOUNT NO.:  [id number]   MEDICAL RECORD NO.:  [id number]                  PATIENT TYPE:   LOCATION:                                       FACILITY:   PHYSICIAN:  Lionel December, M.D.                 DATE OF BIRTH:   DATE OF CONSULTATION:  01/16/2004  DATE OF DISCHARGE:                                   CONSULTATION   ADDENDUM   PAST SURGICAL HISTORY:  Vasectomy 11 years ago.   FAMILY HISTORY:  No known family history of colorectal carcinoma, liver, or  other chronic GI problems..  Mother, age 39, is alive and relatively  healthy.  Father, deceased, at age 45 secondary to complications of diabetes  mellitus. He has 2 healthy sisters and 1 healthy brother.   SOCIAL HISTORY:  [name] is married for 15 years.  He has 2 healthy  children.  He is employed full time with [employer].  He currently  reports about one-third pack per day of cigarette use, intermittently over  the last 30 years.  He does consume 1-2 beers a day for the last 30 years.  He denies any drug use.   REVIEW OF SYSTEMS:  CONSTITUTIONAL:  Weight is stable.  Appetite is okay.  Denies any fever or chills.  GI:  See HPI. Denies any dysphagia or  odynophagia.  CARDIOVASCULAR:  Denies any chest pain or palpitations.  PULMONARY:  He does report occasional shortness of breath on exertion.  Denies any cough or hemoptysis.   PHYSICAL EXAMINATION:  VITAL SIGNS:  Weight 202 pounds.  Height 72 inches.  Temperature 97.9, blood pressure 140/70, pulse 82.  GENERAL:  [name] is a 55 year old well developed, well nourished,  Caucasian male in no acute distress.  He is alert, oriented, pleasant and  cooperative.  HEENT:  Sclerae are clear.  Nonicteric.  Conjunctivae pink. Oropharynx pink  and moist without any lesions.  NECK:  Supple without any masses or thyromegaly.  HEART:  Regular rate and rhythm with a normal S1 and S2 without any murmurs  clicks, rubs or  gallops.  ABDOMEN:  Positive bowel sounds x4.  No bruits auscultated.  Soft,  nontender, nondistended with no palpable mass or hepatosplenomegaly.  No  rebound tenderness or guarding.  RECTAL:  Exam reveals 2-3 cm, large external hemorrhoids which do not appear  to be actively thrombosed or bleeding.  Good sphincter tone.  Small amount  of formed, light brown, Hemoccult-negative stool was obtained from the vault  and no masses palpated.  The prostate is slightly enlarged, nontender.   ASSESSMENT:  [name] is a 55 year old Caucasian male with a 2-day  history of bright red rectal bleeding of moderate-to-large amount post  defecation; approximately 1 month ago.  Episode was self-limited and not  associated with any pain.  Given [name] age of 55, would recommend  further evaluation  at this time.  It is possible that bleeding may be due to  benign anorectal source such as hemorrhoids and given his history of  diverticulosis he is at risk for diverticular bleed as well, but would also  warrant ruling out colorectal carcinoma.   RECOMMENDATIONS:  Will schedule colonoscopy with Dr. Karilyn Cota in the near  future.  I have discussed the procedure including risks and benefits which  include, but are not limited to, bleeding, perforation, and infection and  drug reaction.  [name] agrees with this plan.  Consent will be  obtained. He is asked to hold his aspirin 3 days prior to the procedure.  Will schedule follow up pending procedures.   We would like to thank Dr. Regino Schultze for allowing [place] to participate in the  care of [name].     ________________________________________  ___________________________________________  Nicholas Lose, N.P.                  Lionel December, M.D.   KC/MEDQ  D:  01/16/2004  T:  01/16/2004  Job:  [id number]   cc:   Lionel December, M.D.  [address]  [place]  [place] [address]  Fax: [phone]   Kirk Ruths, M.D.  [address]  [place]  [place]  [address]  Fax: [phone]

## 2011-01-29 NOTE — Consult Note (Signed)
[name], [name]                          ACCOUNT NO.:  [id number]   MEDICAL RECORD NO.:  [id number]                  PATIENT TYPE:   LOCATION:                                       FACILITY:   PHYSICIAN:  Lionel December, M.D.                 DATE OF BIRTH:  10-08-1955   DATE OF CONSULTATION:  01/15/2004  DATE OF DISCHARGE:                                   CONSULTATION   GASTROINTESTINAL CONSULT   REQUESTING PHYSICIAN:  Kirk Ruths, M.D.   REASON FOR CONSULTATION:  Rectal bleeding.   HISTORY OF PRESENT ILLNESS:  [name] is a 55 year old Caucasian male who  reports self-limited episode of 2 days of bright red rectal bleeding prior  to his annual physical with Dr. Regino Schultze. He notes large amount of bright red  bleeding post defecation.  He denies any clots.  He denies any mucus.  He  denies any melena.  [name] denies any precipitating factors including  constipation or straining. He noted the bleeding on the toilet paper and in  the toilet water.  It was not mixed in the stool.  He denies any associated  abdominal pain without episode of bleeding; however, he does have occasional  bilateral lower quadrant intermittent aching.  He denies any proctalgia or  rectal pruritus.  He does have history of external hemorrhoids approximately  3 years; however, he denies any current flares of hemorrhoids.  He denies  any diarrhea.  He denies any heartburn, indigestion, reflux, nausea, or  vomiting.  Bowel movements are typically soft and brown and on a daily  basis.  He has been on aspirin 80 mg daily for the last 3 or 4 weeks;  however, this was after his episode of rectal bleeding.   CURRENT MEDICATIONS:  Aspirin 81 mg daily.   ALLERGIES:  No known drug allergies.   PAST MEDICAL HISTORY:  Prostatitis, diverticulosis, anxiety, Also of note,  he was found to have 3 tiny lesions in the left liver compatible with very  tiny cysts.  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  [name] states these were  initially found in October of 2002 and have remained stable.  Follow up CT  from   Dictation ended at this point.     ________________________________________  ___________________________________________  Nicholas Lose, N.P.                  Lionel December, M.D.   KC/MEDQ  D:  01/16/2004  T:  01/16/2004  Job:  [id number]

## 2011-01-29 NOTE — Op Note (Signed)
NAME:  [name], [name]                        ACCOUNT NO.:  [id number]   MEDICAL RECORD NO.:  [id number]                   PATIENT TYPE:  AMB   LOCATION:  DAY                                  FACILITY:  [facility]   PHYSICIAN:  Lionel December, M.D.                 DATE OF BIRTH:  [birth year]   DATE OF PROCEDURE:  01/29/2004  DATE OF DISCHARGE:                                 OPERATIVE REPORT   PROCEDURE:  Total colonoscopy.   INDICATIONS FOR PROCEDURE:  [name] is a 55 year old Caucasian male who has had  a few episodes of hematochezia.  He is therefore undergoing diagnostic  colonoscopy.  The procedure and risks were reviewed with the patient, and  informed consent was obtained.   PREOPERATIVE MEDICATIONS:  Demerol 50 mg IV, Versed 6 mg IV in divided dose.   FINDINGS:  The procedure was performed in the endoscopy suite.  The  patient's vital signs and O2 saturations were monitored during the procedure  and remained stable.  The patient was placed in the left lateral recumbent  position, and the Olympus videoscope was placed into the rectum and advanced  into the region of the sigmoid colon and beyond.  A few tiny diverticula  were noted in the sigmoid colon.  The preparation was excellent.  The scope  was passed to the cecum which was identified by the appendiceal orifice and  ileocecal valve.  A short segment of  TI was also examined and was normal.  As the scope was withdrawn, the colonic mucosa was once again carefully  examined and was normal throughout.  The rectal mucosa similarly was normal.  The scope was retroflexed to examine the anorectal junction, and small to  moderate-sized external hemorrhoids were noted below the dentate line.  The  endoscope was straightened and withdrawn.  The patient tolerated the  procedure well.   FINAL DIAGNOSES:  External hemorrhoids.  Otherwise normal colonoscopy.  Normal terminal ileoscopy.   RECOMMENDATIONS:  1. High fiber diet.  2. He should  continue yearly Hemoccults and will not need another exam for     10 years unless he has new symptoms.      ___________________________________________                                            Lionel December, M.D.   NR/MEDQ  D:  01/29/2004  T:  01/29/2004  Job:  [id number]   cc:   Kirk Ruths, M.D.  [address]  [place]  [place] [address]  Fax: [phone]

## 2011-06-04 LAB — BASIC METABOLIC PANEL
BUN: 8
Calcium: 9.5
Chloride: 106
Creatinine, Ser: 0.96
GFR calc Af Amer: >60
GFR calc non Af Amer: >60
Glucose, Bld: 114 — ABNORMAL HIGH
Potassium: 4
Potassium: 3.9
Sodium: 139

## 2011-06-04 LAB — URINALYSIS, ROUTINE W REFLEX MICROSCOPIC
Glucose, UA: NEGATIVE
Ketones, ur: NEGATIVE
Nitrite: NEGATIVE
Protein, ur: NEGATIVE
pH: 7

## 2011-06-04 LAB — CBC
HCT: 38.6 — ABNORMAL LOW
HCT: 34 — ABNORMAL LOW
Hemoglobin: 13.5
MCHC: 34.6
MCV: 92.1
Platelets: 222
RBC: 4.27
RDW: 13
RDW: 13.3
WBC: 5.6

## 2011-06-04 LAB — DIFFERENTIAL
Basophils Absolute: 0
Eosinophils Relative: 2
Lymphocytes Relative: 31
Lymphs Abs: 1.7
Monocytes Absolute: 0.4
Neutro Abs: 3.3

## 2011-06-04 LAB — APTT: aPTT: 26

## 2011-06-04 LAB — TYPE AND SCREEN: ABO/RH(D): A POS

## 2011-06-15 LAB — BASIC METABOLIC PANEL
BUN: 11
CO2: 29
Calcium: 8.5
Calcium: 9.3
Creatinine, Ser: 0.82
GFR calc Af Amer: >60
GFR calc non Af Amer: >60
Glucose, Bld: 119 — ABNORMAL HIGH
Potassium: 4.4
Sodium: 138

## 2011-06-15 LAB — TYPE AND SCREEN: ABO/RH(D): A POS

## 2011-06-15 LAB — APTT: aPTT: 27

## 2011-06-15 LAB — DIFFERENTIAL
Eosinophils Absolute: 0.1
Lymphocytes Relative: 28
Lymphs Abs: 1.8
Neutro Abs: 3.9
Neutrophils Relative %: 61

## 2011-06-15 LAB — CBC
HCT: 36.4 — ABNORMAL LOW
Hemoglobin: 12.1 — ABNORMAL LOW
MCHC: 33.4
MCV: 95.1
Platelets: 252
RDW: 13.4
WBC: 6.5

## 2011-06-15 LAB — URINALYSIS, ROUTINE W REFLEX MICROSCOPIC
Hgb urine dipstick: NEGATIVE
Protein, ur: NEGATIVE
Urobilinogen, UA: 0.2

## 2011-06-15 LAB — PROTIME-INR
INR: 1
Prothrombin Time: 13

## 2011-06-30 LAB — BASIC METABOLIC PANEL
BUN: 12
Calcium: 9.2
Creatinine, Ser: 0.8
GFR calc non Af Amer: >60
Glucose, Bld: 103 — ABNORMAL HIGH

## 2011-11-15 ENCOUNTER — Other Ambulatory Visit (HOSPITAL_COMMUNITY): Payer: Self-pay | Admitting: Family Medicine

## 2011-11-15 ENCOUNTER — Ambulatory Visit (HOSPITAL_COMMUNITY)
Admission: RE | Admit: 2011-11-15 | Discharge: 2011-11-15 | Disposition: A | Discharge status: Home / Self Care | Payer: 59 | Source: Ambulatory Visit | Attending: Family Medicine | Admitting: Family Medicine

## 2011-11-15 DIAGNOSIS — R05 Cough: Secondary | ICD-10-CM

## 2011-11-15 DIAGNOSIS — R059 Cough, unspecified: Secondary | ICD-10-CM | POA: Insufficient documentation

## 2011-11-15 DIAGNOSIS — Z0000 Encounter for general adult medical examination without abnormal findings: Secondary | ICD-10-CM | POA: Insufficient documentation

## 2011-11-15 IMAGING — CR DG CHEST 2V
2 series · 2 of 2 positions shown · non-contrast
Comparison: 11/10/2010.

CLINICAL DATA: Cough.

CHEST - 2 VIEW

[view not recorded (1 of 2)]
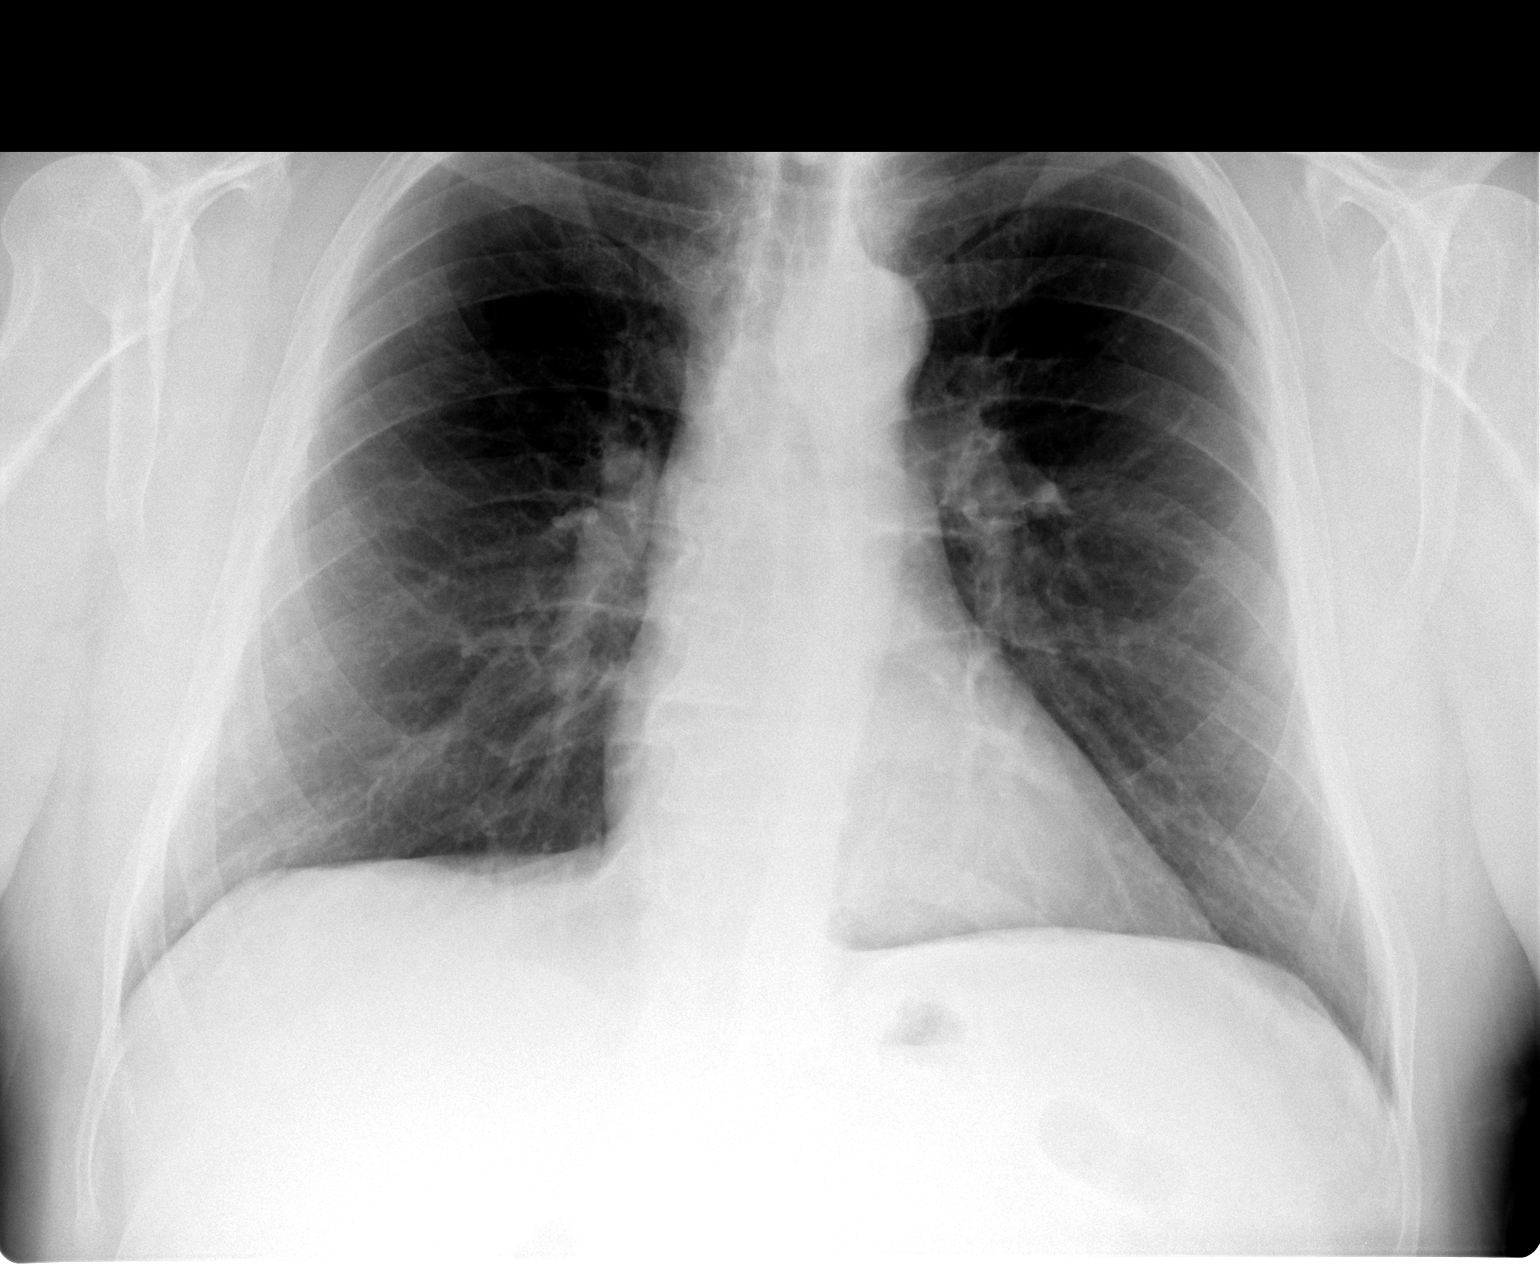

[view not recorded (2 of 2)]
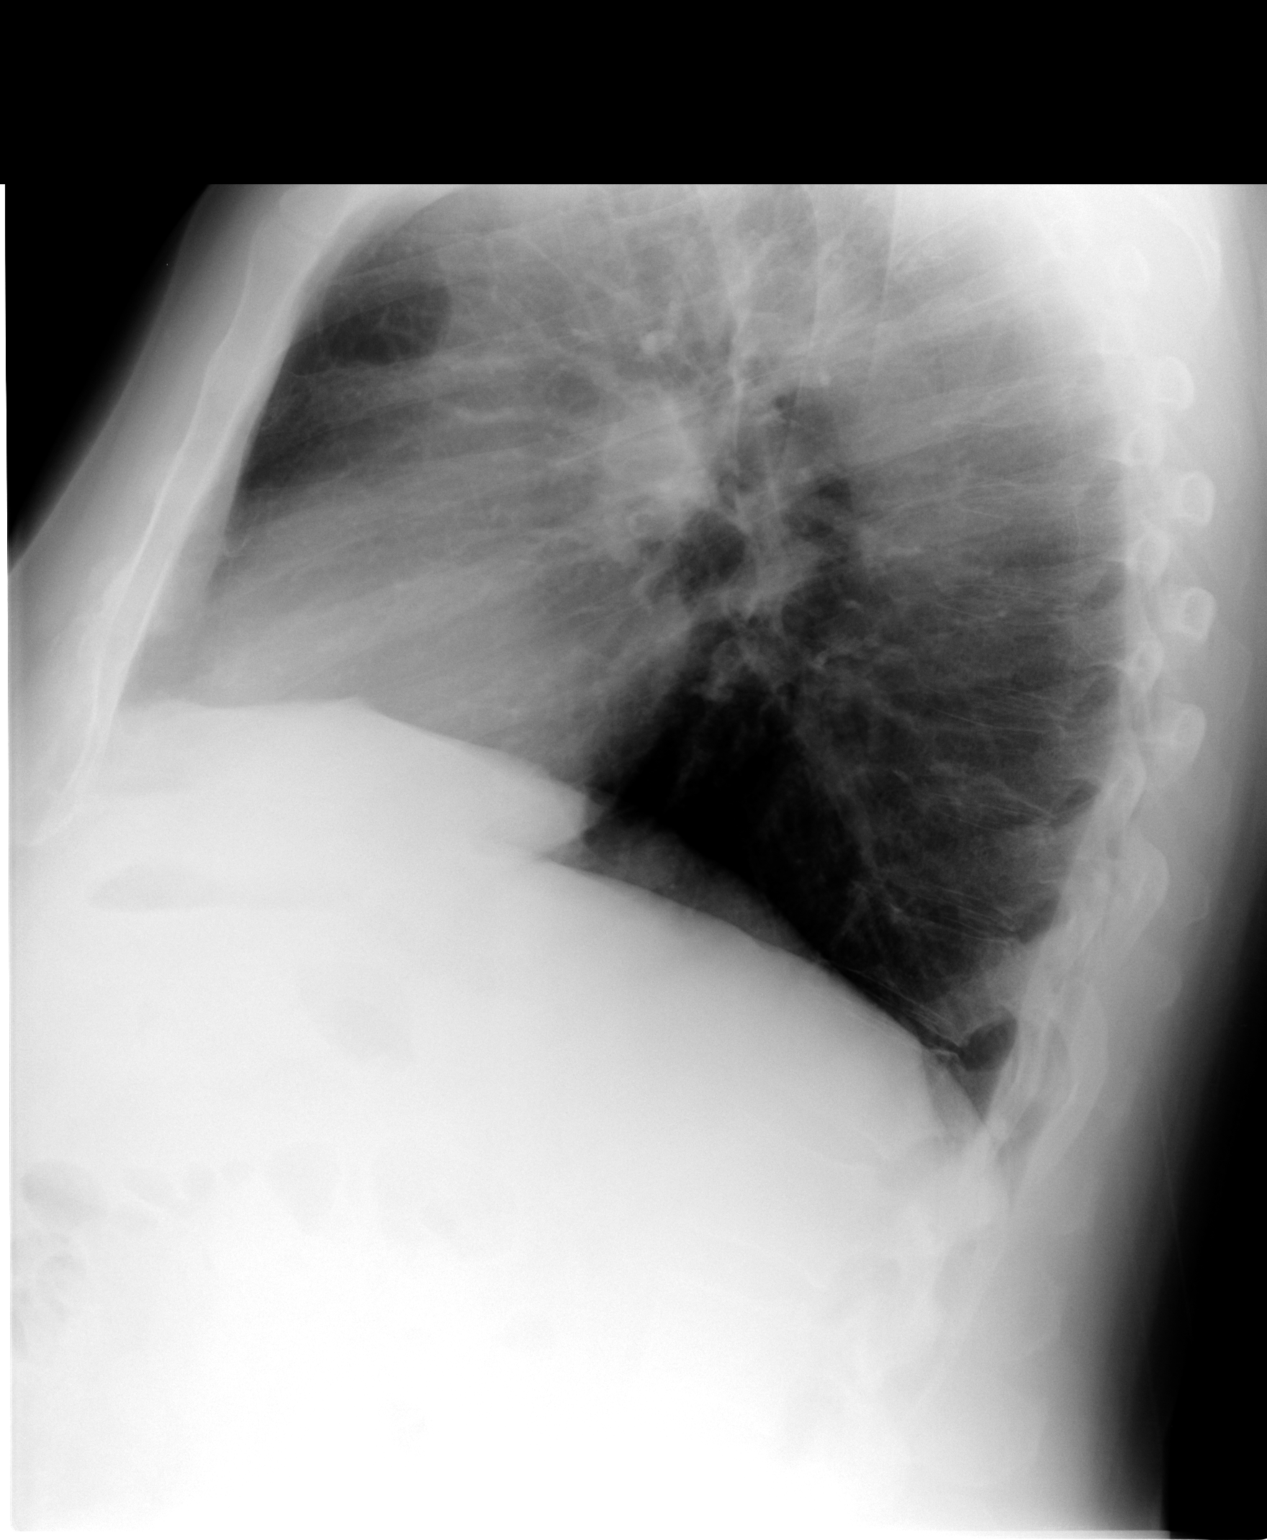

[2 of 2 positions shown; findings below may reference images not displayed]

FINDINGS: Trachea is midline.  Heart size normal.  Lungs are clear.
No pleural fluid.
IMPRESSION: No acute findings.

## 2011-12-13 HISTORY — PX: OTHER SURGICAL HISTORY: SHX169

## 2011-12-22 ENCOUNTER — Other Ambulatory Visit (HOSPITAL_COMMUNITY): Payer: Self-pay | Admitting: Internal Medicine

## 2011-12-22 DIAGNOSIS — I10 Essential (primary) hypertension: Secondary | ICD-10-CM

## 2011-12-22 DIAGNOSIS — E785 Hyperlipidemia, unspecified: Secondary | ICD-10-CM

## 2011-12-29 ENCOUNTER — Ambulatory Visit (HOSPITAL_COMMUNITY)
Admission: RE | Admit: 2011-12-29 | Discharge: 2011-12-29 | Disposition: A | Discharge status: Home / Self Care | Payer: 59 | Source: Ambulatory Visit | Attending: Internal Medicine | Admitting: Internal Medicine

## 2011-12-29 DIAGNOSIS — I10 Essential (primary) hypertension: Secondary | ICD-10-CM

## 2011-12-29 DIAGNOSIS — I2510 Atherosclerotic heart disease of native coronary artery without angina pectoris: Secondary | ICD-10-CM | POA: Insufficient documentation

## 2011-12-29 DIAGNOSIS — E785 Hyperlipidemia, unspecified: Secondary | ICD-10-CM

## 2011-12-29 IMAGING — CT CT HEART SCORING
2 series · 16 of 20 positions shown, 18 images · non-contrast
Comparison: 11/15/2011.

CLINICAL DATA: Coronary artery disease.  Coronary artery
calcification.  Former smoker.

CT HEART WITH CALCIUM SCORING
TECHNIQUE: Contiguous axial images were obtained through the heart
without IV contrast.  Coronary calcium scoring was performed.

[Series 2: calcium score · axial · 0.49mm/px · z∈[-193,-103]mm · 8 of 48 slices shown, 10 images]
[im 6/48  vessel]
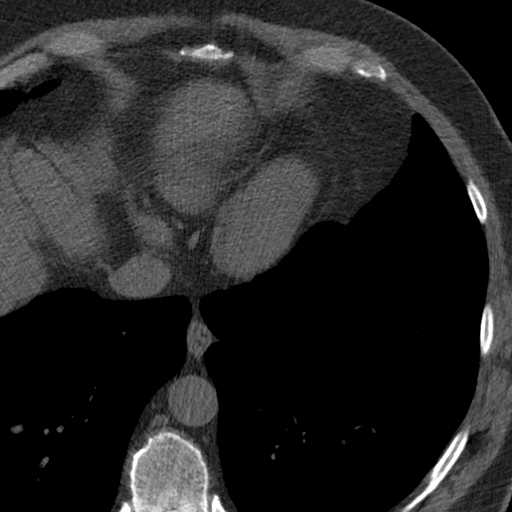
[im 6/48  lung]
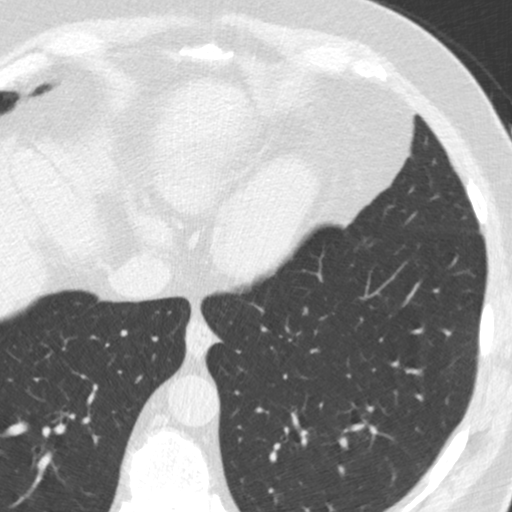
[im 11/48  vessel]
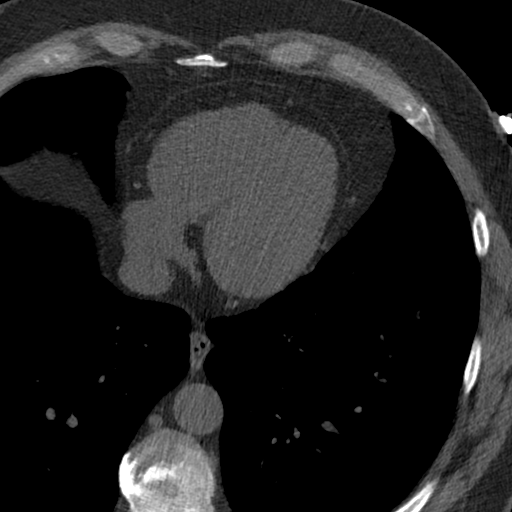
[im 16/48  vessel]
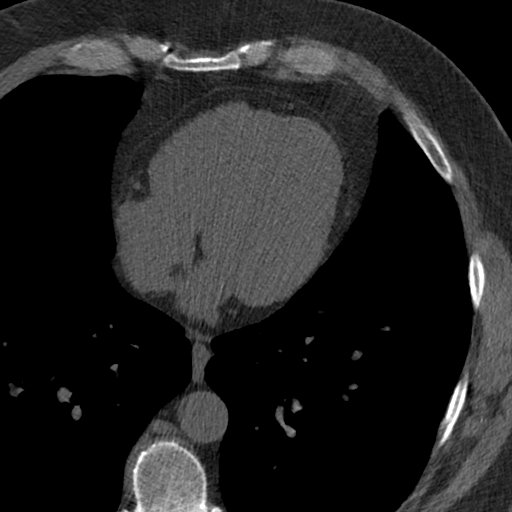
[im 21/48  vessel]
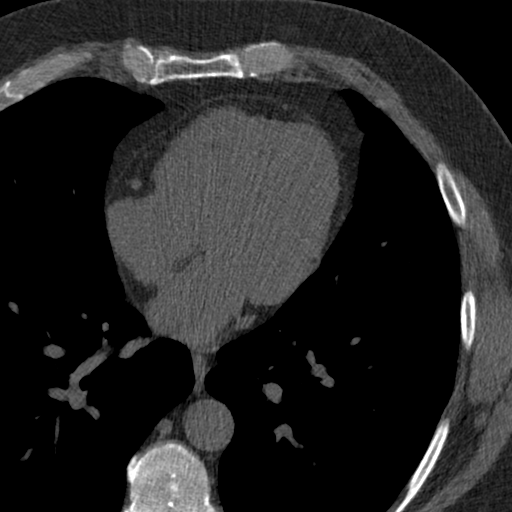
[im 27/48  vessel]
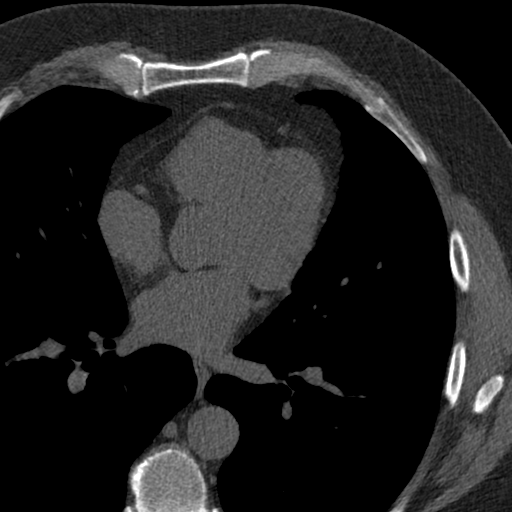
[im 27/48  lung]
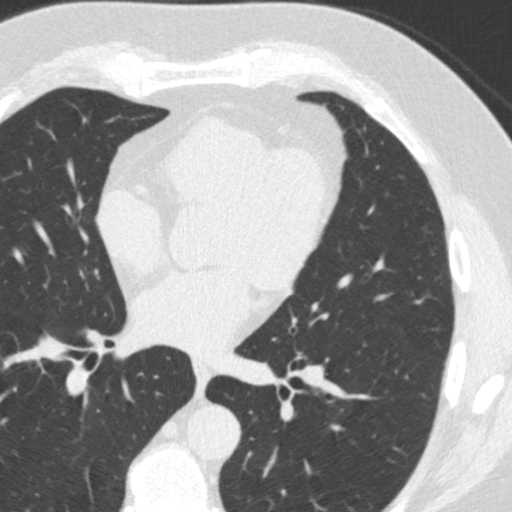
[im 32/48  vessel]
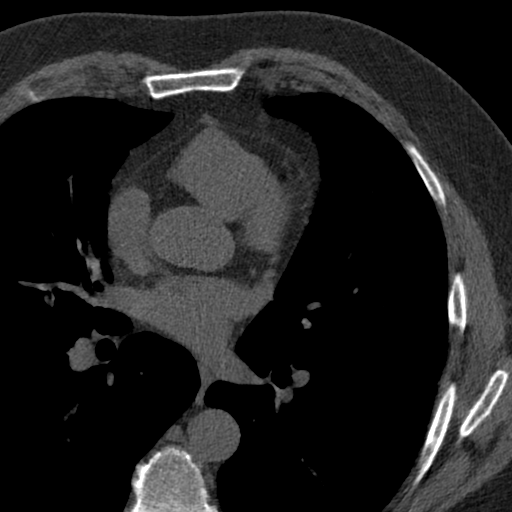
[im 37/48  vessel]
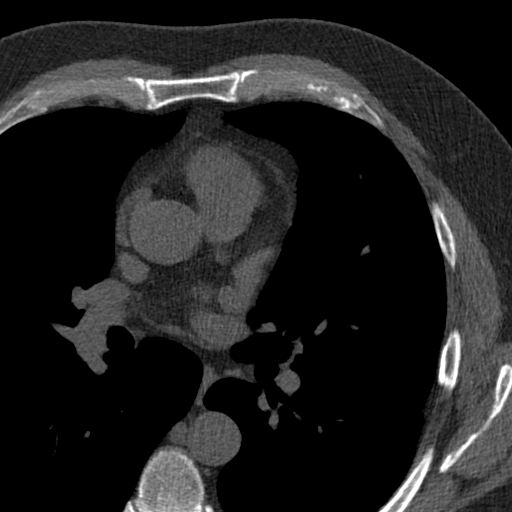
[im 42/48  vessel]
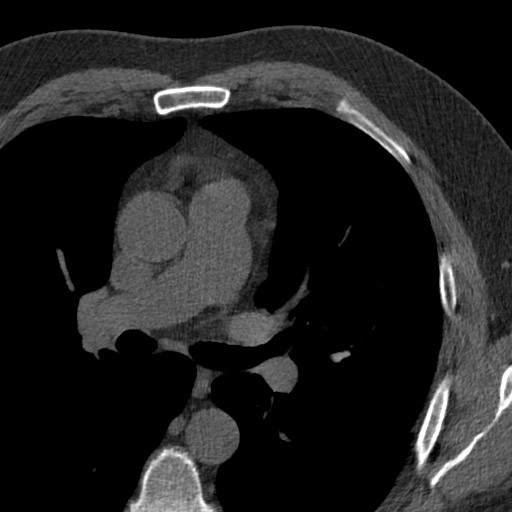

[Series 5: soft w/o · axial · non-contrast · 0.78mm/px · z∈[-193,-103]mm · 8 of 48 slices shown]
[im 6/48  vessel]
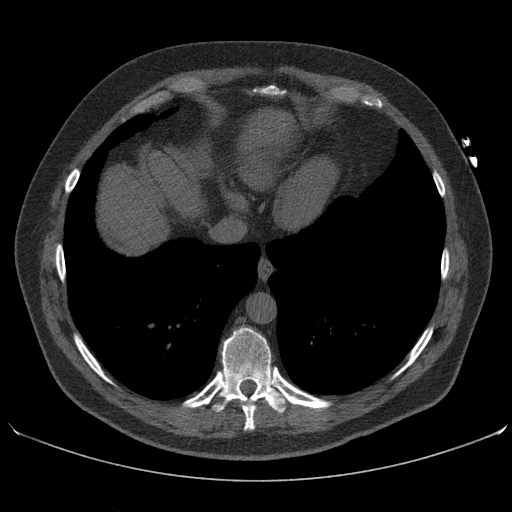
[im 11/48  vessel]
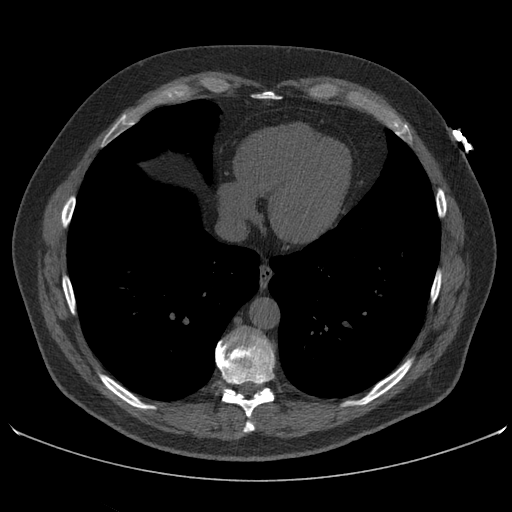
[im 16/48  vessel]
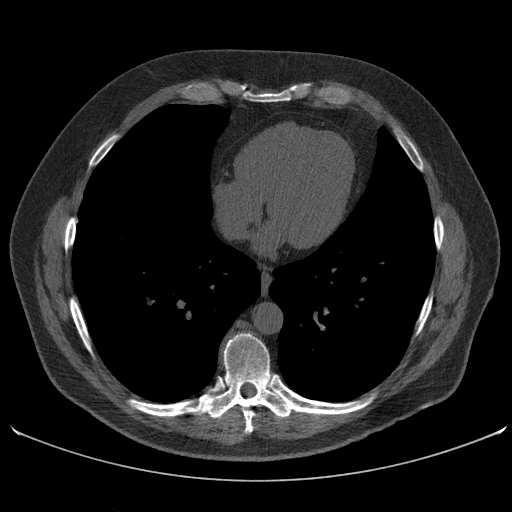
[im 21/48  vessel]
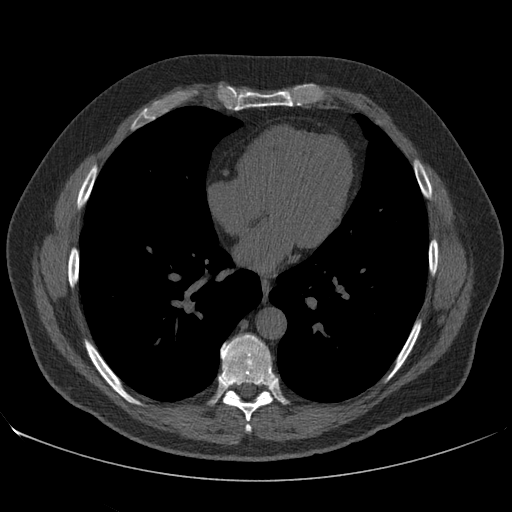
[im 27/48  vessel]
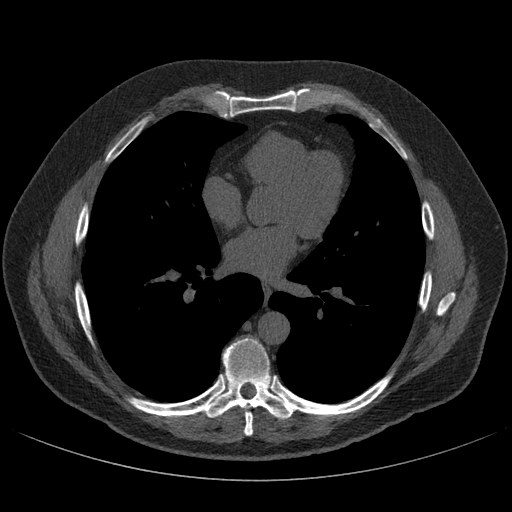
[im 32/48  vessel]
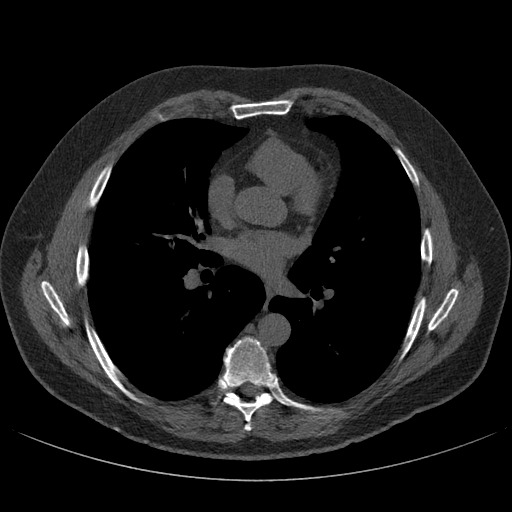
[im 37/48  vessel]
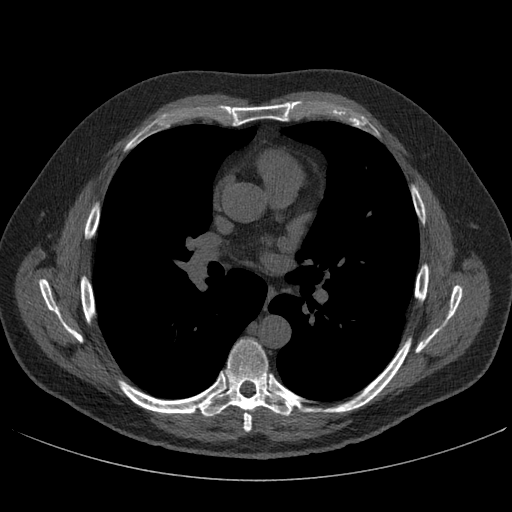
[im 42/48  vessel]
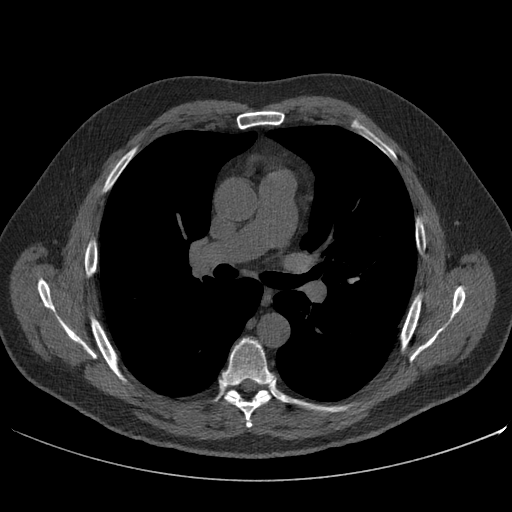

[16 of 20 positions shown; findings below may reference images not displayed]

FINDINGS: There is a small bleb or bulla adjacent to the right
hilum in the medial right upper lobe (image 1 series 4) that is
partially visualized.  No adenopathy is identified.  Incidental
visualization of the liver shows a 1 cm low density lesion in the
left hepatic lobe, statistically likely a cyst but incompletely
evaluated.  In a patient without history malignancy, no further
assessment is recommended.  The lungs appear clear.  There are no
aggressive osseous lesions.

The patient has no coronary artery calcium.  Right dominant
coronary artery is present.  No pericardial effusion.
IMPRESSION: No coronary artery calcium.  Incidental findings of small right
upper lobe bleb and 1 cm cystic lesion in the left hepatic lobe,
statistically likely a cyst.

## 2012-03-29 DIAGNOSIS — IMO0002 Reserved for concepts with insufficient information to code with codable children: Secondary | ICD-10-CM | POA: Insufficient documentation

## 2012-03-29 DIAGNOSIS — H01009 Unspecified blepharitis unspecified eye, unspecified eyelid: Secondary | ICD-10-CM | POA: Insufficient documentation

## 2012-05-01 DIAGNOSIS — R972 Elevated prostate specific antigen [PSA]: Secondary | ICD-10-CM | POA: Insufficient documentation

## 2012-05-01 DIAGNOSIS — N400 Enlarged prostate without lower urinary tract symptoms: Secondary | ICD-10-CM | POA: Insufficient documentation

## 2013-07-02 ENCOUNTER — Telehealth: Payer: Self-pay | Admitting: Internal Medicine

## 2013-07-02 DIAGNOSIS — Z79899 Other long term (current) drug therapy: Secondary | ICD-10-CM

## 2013-07-02 DIAGNOSIS — E785 Hyperlipidemia, unspecified: Secondary | ICD-10-CM

## 2013-07-02 NOTE — Telephone Encounter (Signed)
Please mail out a lab order for [name] before his appt on 11/07/104 @2 :30 w/Dr. Rennis Golden    Thanks

## 2013-07-02 NOTE — Telephone Encounter (Signed)
Labs ordered to be drawn prior to patient's yearly OV 07/20/13

## 2013-07-13 LAB — COMPREHENSIVE METABOLIC PANEL
AST: 22 U/L (ref 0–37)
BUN: 14 mg/dL (ref 6–23)
CO2: 25 mEq/L (ref 19–32)
Calcium: 9.6 mg/dL (ref 8.4–10.5)
Chloride: 103 mEq/L (ref 96–112)
Creat: 1.01 mg/dL (ref 0.50–1.35)
Total Bilirubin: 0.5 mg/dL (ref 0.3–1.2)

## 2013-07-15 LAB — NMR LIPOPROFILE WITH LIPIDS
Cholesterol, Total: 138 mg/dL (ref <200)
LDL Particle Number: 1182 nmol/L — ABNORMAL HIGH (ref <1000)
LDL Size: 20.7 nm (ref >20.5)
Large VLDL-P: 1.4 nmol/L (ref <=2.7)
Small LDL Particle Number: 431 nmol/L (ref <=527)
VLDL Size: 43.7 nm (ref <=46.6)

## 2013-07-18 ENCOUNTER — Encounter: Payer: Self-pay | Admitting: *Deleted

## 2013-07-20 ENCOUNTER — Encounter: Payer: Self-pay | Admitting: Internal Medicine

## 2013-07-20 ENCOUNTER — Ambulatory Visit (INDEPENDENT_AMBULATORY_CARE_PROVIDER_SITE_OTHER): Payer: 59 | Admitting: Internal Medicine

## 2013-07-20 VITALS — BP 122/68 | HR 87 | Ht 72.0 in | Wt 236.0 lb

## 2013-07-20 DIAGNOSIS — E669 Obesity, unspecified: Secondary | ICD-10-CM

## 2013-07-20 DIAGNOSIS — I10 Essential (primary) hypertension: Secondary | ICD-10-CM

## 2013-07-20 DIAGNOSIS — E785 Hyperlipidemia, unspecified: Secondary | ICD-10-CM

## 2013-07-20 DIAGNOSIS — E66811 Obesity, class 1: Secondary | ICD-10-CM

## 2013-07-20 NOTE — Patient Instructions (Signed)
Please have fasting blood work to check your cholesterol in about 6 months - prior to your next office visit.  Your physician wants you to follow-up in: 6 months. You will receive a reminder letter in the mail two months in advance. If you don't receive a letter, please call our office to schedule the follow-up appointment.

## 2013-07-23 ENCOUNTER — Encounter: Payer: Self-pay | Admitting: Internal Medicine

## 2013-07-23 DIAGNOSIS — E669 Obesity, unspecified: Secondary | ICD-10-CM | POA: Insufficient documentation

## 2013-07-23 DIAGNOSIS — E66811 Obesity, class 1: Secondary | ICD-10-CM | POA: Insufficient documentation

## 2013-07-23 DIAGNOSIS — I10 Essential (primary) hypertension: Secondary | ICD-10-CM | POA: Insufficient documentation

## 2013-07-23 DIAGNOSIS — E785 Hyperlipidemia, unspecified: Secondary | ICD-10-CM | POA: Insufficient documentation

## 2013-07-23 NOTE — Progress Notes (Signed)
OFFICE NOTE  Chief Complaint:  Routine follow-up  Primary Care Physician: Marc Ribas, MD  HPI:  [name]  is a 57 year old gentleman with hypertension and dyslipidemia who has a strong family history of heart disease, and his father died at age 107 and had heart failure. He was seen by me and I recommended a coronary calcium score to help determine his degree of cardiac risk. This was performed on December 29, 2011 which showed 0 coronary calcium. As previously mentioned, there was an incidental cystic lesion on the liver which was not recommended to be follow up.  In addition he returns today after 6 months of dietary modifications and the addition of red yeast rice to see if he has had any improvements in his lipid profile. Previously his total cholesterol was 181, triglycerides 88, HDL 41, and LDL 122. I did perform NMR particle sizes in addition to particle content which now demonstrated LDL particle number of 37 and a calcium LDL of 122. HDL 47, triglycerides 72, and an LP-IR score of 58. Despite the improvement, I felt that his risk still remained high and I recommended starting low-dose atorvastatin 10 mg daily.  He is tolerating that well and has had a marked improvement of his particle numbers. His LDL particle number is 1182, with a LDL [name]. of 78.  For someone with a negative coronary calcium score, these numbers are excellent and I would recommend no changes.  PMHx:  Past Medical History  Diagnosis Date  . Hypertension   . Dyslipidemia   . Family history of heart disease     father died @ 37 of HF  . Obesity   . OA (osteoarthritis)     Past Surgical History  Procedure Laterality Date  . Coronary calcium score  12/2011    zero coronary calcium  . Knee surgery Right 2009, 2010    partial right knee replacement  . Vasectomy  1990    FAMHx:  Family History  Problem Relation Age of Onset  . Heart failure Father   . Diabetes Father   . Atrial fibrillation  Mother     SOCHx:   reports that he quit smoking about 8 years ago. His smoking use included Cigarettes. He smoked 0.00 packs per day for 30 years. He has never used smokeless tobacco. He reports that he drinks about 6.0 ounces of alcohol per week. He reports that he does not use illicit drugs.  ALLERGIES:  No Known Allergies  ROS: A comprehensive review of systems was negative.  HOME MEDS: Current Outpatient Prescriptions  Medication Sig Dispense Refill  . aspirin 81 MG tablet Take 81 mg by mouth daily.      Marland Kitchen atorvastatin (LIPITOR) 10 MG tablet Take 1 tablet by mouth daily.      . fish oil-omega-3 fatty acids 1000 MG capsule Take 2 g by mouth daily.       Marland Kitchen loratadine (CLARITIN) 10 MG tablet Take 10 mg by mouth daily.      Marland Kitchen olmesartan (BENICAR) 20 MG tablet Take 20 mg by mouth daily.       No current facility-administered medications for this visit.    LABS/IMAGING: No results found for this or any previous visit (from the past 48 hour(s)). No results found.  VITALS: BP 122/68  Pulse 87  Ht 6' (1.829 m)  Wt 236 lb (107.049 kg)  BMI 32.00 kg/m2  EXAM: General appearance: alert and no distress Neck: no carotid bruit  and no JVD Lungs: clear to auscultation bilaterally Heart: regular rate and rhythm, S1, S2 normal, no murmur, click, rub or gallop Abdomen: soft, non-tender; bowel sounds normal; no masses,  no organomegaly Extremities: extremities normal, atraumatic, no cyanosis or edema Pulses: 2+ and symmetric Skin: Skin color, texture, turgor normal. No rashes or lesions Neurologic: Grossly normal Psych: Mood, affect normal  EKG: Sinus rhythm with a PVC at 87  ASSESSMENT: 1. Dyslipidemia-well controlled 2. Hypertension-well controlled 3. Obesity with weight loss efforts in place 4. Family history of premature coronary disease 5. Zero coronary calcium score on 12/29/2011  PLAN: 1.   [name] is doing well and tolerating low-dose Lipitor. His lipid profile  is very well controlled. I recommended no changes at this time except for continued work on weight loss and exercise. I'll plan to see him back in a year with a repeat lipid profile about 2 weeks prior to that visit.  Marc Nose, MD, [facility] Attending Cardiologist [facility]  [name] 07/23/2013, 6:10 PM

## 2013-12-07 ENCOUNTER — Encounter (INDEPENDENT_AMBULATORY_CARE_PROVIDER_SITE_OTHER): Payer: Self-pay | Admitting: *Deleted

## 2014-01-16 ENCOUNTER — Other Ambulatory Visit (INDEPENDENT_AMBULATORY_CARE_PROVIDER_SITE_OTHER): Payer: Self-pay | Admitting: *Deleted

## 2014-01-16 ENCOUNTER — Encounter (INDEPENDENT_AMBULATORY_CARE_PROVIDER_SITE_OTHER): Payer: Self-pay | Admitting: *Deleted

## 2014-01-16 DIAGNOSIS — Z1211 Encounter for screening for malignant neoplasm of colon: Secondary | ICD-10-CM

## 2014-02-25 ENCOUNTER — Telehealth (INDEPENDENT_AMBULATORY_CARE_PROVIDER_SITE_OTHER): Payer: Self-pay | Admitting: *Deleted

## 2014-02-25 DIAGNOSIS — Z1211 Encounter for screening for malignant neoplasm of colon: Secondary | ICD-10-CM

## 2014-02-25 NOTE — Telephone Encounter (Signed)
Patient needs movi prep 

## 2014-03-07 MED ORDER — PEG-KCL-NACL-NASULF-NA ASC-C 100 G PO SOLR: 1.0000 | Freq: Once | ORAL | Status: DC

## 2014-03-27 ENCOUNTER — Telehealth (INDEPENDENT_AMBULATORY_CARE_PROVIDER_SITE_OTHER): Payer: Self-pay | Admitting: *Deleted

## 2014-03-27 NOTE — Telephone Encounter (Signed)
  Procedure: tcs  Reason/Indication:  screening  Has patient had this procedure before?  Yes, 10 years ago  If so, when, by whom and where?    Is there a family history of colon cancer?  no  Who?  What age when diagnosed?    Is patient diabetic?   no      Does patient have prosthetic heart valve?  no  Do you have a pacemaker?  no  Has patient ever had endocarditis? no  Has patient had joint replacement within last 12 months?  no  Does patient tend to be constipated or take laxatives? no  Is patient on Coumadin, Plavix and/or Aspirin? yes  Medications: asa 81 mg daily, lipitor 10 mg daily, claritin 10 mg daily, benicar 20 mg daily, fish oil daily  Allergies: nkda  Medication Adjustment: asa 2 days  Procedure date & time: 04/17/14 at 730

## 2014-03-28 NOTE — Telephone Encounter (Signed)
agree

## 2014-04-12 ENCOUNTER — Encounter (HOSPITAL_COMMUNITY): Payer: Self-pay | Admitting: Pharmacy Technician

## 2014-04-17 ENCOUNTER — Ambulatory Visit (HOSPITAL_COMMUNITY)
Admission: RE | Admit: 2014-04-17 | Discharge: 2014-04-17 | Disposition: A | Discharge status: Home / Self Care | Payer: 59 | Source: Ambulatory Visit | Attending: Internal Medicine | Admitting: Internal Medicine

## 2014-04-17 ENCOUNTER — Encounter (HOSPITAL_COMMUNITY): Payer: Self-pay | Admitting: *Deleted

## 2014-04-17 ENCOUNTER — Encounter (HOSPITAL_COMMUNITY)
Admission: RE | Disposition: A | Discharge status: Home / Self Care | Payer: Self-pay | Source: Ambulatory Visit | Attending: Internal Medicine

## 2014-04-17 DIAGNOSIS — Z8249 Family history of ischemic heart disease and other diseases of the circulatory system: Secondary | ICD-10-CM | POA: Diagnosis not present

## 2014-04-17 DIAGNOSIS — Z79899 Other long term (current) drug therapy: Secondary | ICD-10-CM | POA: Insufficient documentation

## 2014-04-17 DIAGNOSIS — K644 Residual hemorrhoidal skin tags: Secondary | ICD-10-CM | POA: Insufficient documentation

## 2014-04-17 DIAGNOSIS — D126 Benign neoplasm of colon, unspecified: Secondary | ICD-10-CM | POA: Insufficient documentation

## 2014-04-17 DIAGNOSIS — Z7982 Long term (current) use of aspirin: Secondary | ICD-10-CM | POA: Diagnosis not present

## 2014-04-17 DIAGNOSIS — Z1211 Encounter for screening for malignant neoplasm of colon: Secondary | ICD-10-CM | POA: Diagnosis not present

## 2014-04-17 DIAGNOSIS — Z6832 Body mass index (BMI) 32.0-32.9, adult: Secondary | ICD-10-CM | POA: Diagnosis not present

## 2014-04-17 DIAGNOSIS — I10 Essential (primary) hypertension: Secondary | ICD-10-CM | POA: Diagnosis not present

## 2014-04-17 DIAGNOSIS — E785 Hyperlipidemia, unspecified: Secondary | ICD-10-CM | POA: Diagnosis not present

## 2014-04-17 DIAGNOSIS — E669 Obesity, unspecified: Secondary | ICD-10-CM | POA: Diagnosis not present

## 2014-04-17 DIAGNOSIS — Z87891 Personal history of nicotine dependence: Secondary | ICD-10-CM | POA: Insufficient documentation

## 2014-04-17 HISTORY — PX: COLONOSCOPY: SHX5424

## 2014-04-17 SURGERY — COLONOSCOPY
Anesthesia: Moderate Sedation

## 2014-04-17 MED ORDER — MEPERIDINE HCL 50 MG/ML IJ SOLN
INTRAMUSCULAR | Status: DC | PRN
  Administered 2014-04-17 (×2): 25 mg via INTRAVENOUS

## 2014-04-17 MED ORDER — MIDAZOLAM HCL 5 MG/5ML IJ SOLN
INTRAMUSCULAR | Status: DC | PRN
  Administered 2014-04-17 (×3): 2 mg via INTRAVENOUS

## 2014-04-17 MED ORDER — SODIUM CHLORIDE 0.9 % IV SOLN
INTRAVENOUS | Status: DC
  Administered 2014-04-17: 10:00:00 via INTRAVENOUS

## 2014-04-17 MED ORDER — MIDAZOLAM HCL 5 MG/5ML IJ SOLN
INTRAMUSCULAR | Status: AC
  Filled 2014-04-17: qty 10

## 2014-04-17 MED ORDER — STERILE WATER FOR IRRIGATION IR SOLN
Status: DC | PRN
  Administered 2014-04-17: 10:00:00

## 2014-04-17 MED ORDER — MEPERIDINE HCL 50 MG/ML IJ SOLN
INTRAMUSCULAR | Status: AC
  Filled 2014-04-17: qty 1

## 2014-04-17 NOTE — Op Note (Signed)
COLONOSCOPY PROCEDURE REPORT  PATIENT:  [name]  MR#:  [id number] Birthdate:  November 19, 1955, 58 y.o., male Endoscopist:  Dr. Rogene Houston, MD Referred By:  Dr. Purvis Kilts,, MD  Procedure Date: 04/17/2014  Procedure:   Colonoscopy  Indications:  Patient is 58 year old Caucasian male who is undergoing average risk screening colonoscopy. His last exam was 10 years ago.  Informed Consent:  The procedure and risks were reviewed with the patient and informed consent was obtained.  Medications:  Demerol 50 mg IV Versed 6 mg IV  Description of procedure:  After a digital rectal exam was performed, that colonoscope was advanced from the anus through the rectum and colon to the area of the cecum, ileocecal valve and appendiceal orifice. The cecum was deeply intubated. These structures were well-seen and photographed for the record. From the level of the cecum and ileocecal valve, the scope was slowly and cautiously withdrawn. The mucosal surfaces were carefully surveyed utilizing scope tip to flexion to facilitate fold flattening as needed. The scope was pulled down into the rectum where a thorough exam including retroflexion was performed.  Findings:   Prep satisfactory. Small polyp there are cold biopsy from transverse colon. Few areas of punctate erythema at distal sigmoid colon without erosions or ulceration. Normal rectal mucosa. Prominent hemorrhoids below the dentate line.   Therapeutic/Diagnostic Maneuvers Performed:  See above  Complications:  None  Cecal Withdrawal Time:  13 minutes  Impression:  Examination performed to cecum. Small polyp ablated via cold biopsy from transverse colon. Non-specific finding of punctate erythema and distal sigmoid colon possibly related to aspirin and or prep. External hemorrhoids.  Recommendations:  Standard instructions given. I will contact patient with biopsy results and further recommendations.  Kemya Shed U  04/17/2014  10:32 AM  CC: Dr. Hilma Favors, Betsy Coder, MD & Dr. Rayne Du ref. provider found

## 2014-04-17 NOTE — H&P (Signed)
[name] is an 58 y.o. male.   Chief Complaint: Patient is here for colonoscopy. HPI: Patient is  58 year old Caucasian male who is for screening colonoscopy. His last exam was 10 years ago. he denies abdominal pain change in his bowel habits or rectal bleeding. Family history is negative for CRC.  Past Medical History  Diagnosis Date  . Hypertension   . Dyslipidemia   . Family history of heart disease     father died @ 36 of HF  . Obesity   . OA (osteoarthritis)     Past Surgical History  Procedure Laterality Date  . Coronary calcium score  12/2011    zero coronary calcium  . Knee surgery Right 2009, 2010    partial right knee replacement  . Vasectomy  1990  . Bilateral knee replacements  2009  . Colonoscopy      Family History  Problem Relation Age of Onset  . Heart failure Father   . Diabetes Father   . Atrial fibrillation Mother   . Colon cancer Neg Hx    Social History:  reports that he quit smoking about 9 years ago. His smoking use included Cigarettes. He smoked 0.00 packs per day for 30 years. He has never used smokeless tobacco. He reports that he drinks about 6 ounces of alcohol per week. He reports that he does not use illicit drugs.  Allergies: No Known Allergies  Medications Prior to Admission  Medication Sig Dispense Refill  . aspirin 81 MG tablet Take 81 mg by mouth daily.      Marland Kitchen atorvastatin (LIPITOR) 10 MG tablet Take 1 tablet by mouth daily.      . fish oil-omega-3 fatty acids 1000 MG capsule Take 2 g by mouth daily.       . meloxicam (MOBIC) 15 MG tablet Take 15 mg by mouth daily as needed for pain.      Marland Kitchen olmesartan (BENICAR) 20 MG tablet Take 20 mg by mouth daily.        No results found for this or any previous visit (from the past 48 hour(s)). No results found.  ROS  Blood pressure 149/91, pulse 73, temperature 97.7 F (36.5 C), temperature source Oral, resp. rate 14, height 6' (1.829 m), weight 236 lb (107.049 kg), SpO2  96.00%. Physical Exam  Constitutional: He appears well-developed and well-nourished.  HENT:  Mouth/Throat: Oropharynx is clear and moist.  Eyes: Conjunctivae are normal. No scleral icterus.  Neck: No thyromegaly present.  Cardiovascular: Normal rate, regular rhythm and normal heart sounds.   No murmur heard. Respiratory: Effort normal.  GI: He exhibits no distension and no mass. There is no tenderness.  Musculoskeletal: He exhibits no edema.  Lymphadenopathy:    He has no cervical adenopathy.  Neurological: He is alert.  Skin: Skin is warm and dry.     Assessment/Plan  average risk screening colonoscopy.  [name] U 04/17/2014, 10:02 AM

## 2014-04-17 NOTE — Discharge Instructions (Signed)
Resume usual medications and diet. No driving for 24 hours Physician will call with biopsy results  Colonoscopy, Care After Refer to this sheet in the next few weeks. These instructions provide you with information on caring for yourself after your procedure. Your health care provider may also give you more specific instructions. Your treatment has been planned according to current medical practices, but problems sometimes occur. Call your health care provider if you have any problems or questions after your procedure. WHAT TO EXPECT AFTER THE PROCEDURE  After your procedure, it is typical to have the following: A small amount of blood in your stool. Moderate amounts of gas and mild abdominal cramping or bloating. HOME CARE INSTRUCTIONS Do not drive, operate machinery, or sign important documents for 24 hours. You may shower and resume your regular physical activities, but move at a slower pace for the first 24 hours. Take frequent rest periods for the first 24 hours. Walk around or put a warm pack on your abdomen to help reduce abdominal cramping and bloating. Drink enough fluids to keep your urine clear or pale yellow. You may resume your normal diet as instructed by your health care provider. Avoid heavy or fried foods that are hard to digest. Avoid drinking alcohol for 24 hours or as instructed by your health care provider. Only take over-the-counter or prescription medicines as directed by your health care provider. If a tissue sample (biopsy) was taken during your procedure: Do not take aspirin or blood thinners for 7 days, or as instructed by your health care provider. Do not drink alcohol for 7 days, or as instructed by your health care provider. Eat soft foods for the first 24 hours. SEEK MEDICAL CARE IF: You have persistent spotting of blood in your stool 2-3 days after the procedure. SEEK IMMEDIATE MEDICAL CARE IF: You have more than a small spotting of blood in your stool. You  pass large blood clots in your stool. Your abdomen is swollen (distended). You have nausea or vomiting. You have a fever. You have increasing abdominal pain that is not relieved with medicine. Document Released: 04/13/2004 Document Revised: 06/20/2013 Document Reviewed: 05/07/2013 [facility] Patient Information 2015 [place], [place]. This information is not intended to replace advice given to you by your health care provider. Make sure you discuss any questions you have with your health care provider.    Colon Polyps Polyps are lumps of extra tissue growing inside the body. Polyps can grow in the large intestine (colon). Most colon polyps are noncancerous (benign). However, some colon polyps can become cancerous over time. Polyps that are larger than a pea may be harmful. To be safe, caregivers remove and test all polyps. CAUSES  Polyps form when mutations in the genes cause your cells to grow and divide even though no more tissue is needed. RISK FACTORS  orm when mutations in the genes cause your cells to grow and divide even though no more tissue is needed. RISK FACTORS  There are a number of risk factors that can increase your chances of getting colon polyps. They include:  Being older than 50 years.  Family history of colon polyps or colon cancer.  Long-term colon diseases, such as colitis or Crohn disease.  Being overweight.  Smoking.  Being inactive.  Drinking too much alcohol. SYMPTOMS  Most small polyps do not cause symptoms. If symptoms are present, they may include:  Blood in the stool. The stool may look dark red or black.  Constipation or diarrhea that lasts  longer than 1 week. DIAGNOSIS People often do not know they have polyps until their caregiver finds them during a regular checkup. Your caregiver can use 4 tests to check for polyps:  Digital rectal exam. The caregiver wears gloves and feels inside the rectum. This test would find polyps only in the  rectum.  Barium enema. The caregiver puts a liquid called barium into your rectum before taking X-rays of your colon. Barium makes your colon look white. Polyps are dark, so they are easy to see in the X-ray pictures.  Sigmoidoscopy. A thin, flexible tube (sigmoidoscope) is placed into your rectum. The sigmoidoscope has a light and tiny camera in it. The caregiver uses the sigmoidoscope to look at the last third of your colon.  Colonoscopy. This test is like sigmoidoscopy, but the caregiver looks at the entire colon. This is the most common method for finding and removing polyps. TREATMENT  Any polyps will be removed during a sigmoidoscopy or colonoscopy. The polyps are then tested for cancer. PREVENTION  To help lower your risk of getting more colon polyps:  Eat plenty of fruits and vegetables. Avoid eating fatty foods.  Do not smoke.  Avoid drinking alcohol.  Exercise every day.  Lose weight if recommended by your caregiver.  Eat plenty of calcium and folate. Foods that are rich in calcium include milk, cheese, and broccoli. Foods that are rich in folate include chickpeas, kidney beans, and spinach. HOME CARE INSTRUCTIONS Keep all follow-up appointments as directed by your caregiver. You may need periodic exams to check for polyps. SEEK MEDICAL CARE IF: You notice bleeding during a bowel movement. Document Released: 05/26/2004 Document Revised: 11/22/2011 Document Reviewed: 11/09/2011 [facility] Patient Information 2015 [place], [place]. This information is not intended to replace advice given to you by your health care provider. Make sure you discuss any questions you have with your health care provider.

## 2014-04-22 ENCOUNTER — Encounter (HOSPITAL_COMMUNITY): Payer: Self-pay | Admitting: Internal Medicine

## 2014-04-30 ENCOUNTER — Encounter (INDEPENDENT_AMBULATORY_CARE_PROVIDER_SITE_OTHER): Payer: Self-pay | Admitting: *Deleted

## 2016-12-09 DIAGNOSIS — K429 Umbilical hernia without obstruction or gangrene: Secondary | ICD-10-CM | POA: Diagnosis not present

## 2016-12-09 DIAGNOSIS — Z0001 Encounter for general adult medical examination with abnormal findings: Secondary | ICD-10-CM | POA: Diagnosis not present

## 2016-12-09 DIAGNOSIS — I10 Essential (primary) hypertension: Secondary | ICD-10-CM | POA: Diagnosis not present

## 2016-12-09 DIAGNOSIS — E782 Mixed hyperlipidemia: Secondary | ICD-10-CM | POA: Diagnosis not present

## 2016-12-09 DIAGNOSIS — R7309 Other abnormal glucose: Secondary | ICD-10-CM | POA: Diagnosis not present

## 2016-12-09 DIAGNOSIS — Z1389 Encounter for screening for other disorder: Secondary | ICD-10-CM | POA: Diagnosis not present

## 2017-01-19 DIAGNOSIS — R748 Abnormal levels of other serum enzymes: Secondary | ICD-10-CM | POA: Diagnosis not present

## 2017-07-04 DIAGNOSIS — H524 Presbyopia: Secondary | ICD-10-CM | POA: Diagnosis not present

## 2017-07-04 DIAGNOSIS — H2513 Age-related nuclear cataract, bilateral: Secondary | ICD-10-CM | POA: Diagnosis not present

## 2017-12-20 DIAGNOSIS — Z0001 Encounter for general adult medical examination with abnormal findings: Secondary | ICD-10-CM | POA: Diagnosis not present

## 2017-12-20 DIAGNOSIS — E6609 Other obesity due to excess calories: Secondary | ICD-10-CM | POA: Diagnosis not present

## 2017-12-20 DIAGNOSIS — Z1389 Encounter for screening for other disorder: Secondary | ICD-10-CM | POA: Diagnosis not present

## 2017-12-20 DIAGNOSIS — E782 Mixed hyperlipidemia: Secondary | ICD-10-CM | POA: Diagnosis not present

## 2017-12-20 DIAGNOSIS — R009 Unspecified abnormalities of heart beat: Secondary | ICD-10-CM | POA: Diagnosis not present

## 2018-07-03 DIAGNOSIS — Z23 Encounter for immunization: Secondary | ICD-10-CM | POA: Diagnosis not present

## 2018-07-26 DIAGNOSIS — H2513 Age-related nuclear cataract, bilateral: Secondary | ICD-10-CM | POA: Diagnosis not present

## 2018-07-26 DIAGNOSIS — H35363 Drusen (degenerative) of macula, bilateral: Secondary | ICD-10-CM | POA: Diagnosis not present

## 2018-12-27 DIAGNOSIS — M1991 Primary osteoarthritis, unspecified site: Secondary | ICD-10-CM | POA: Diagnosis not present

## 2018-12-27 DIAGNOSIS — Z1389 Encounter for screening for other disorder: Secondary | ICD-10-CM | POA: Diagnosis not present

## 2018-12-27 DIAGNOSIS — Z6834 Body mass index (BMI) 34.0-34.9, adult: Secondary | ICD-10-CM | POA: Diagnosis not present

## 2018-12-27 DIAGNOSIS — E6609 Other obesity due to excess calories: Secondary | ICD-10-CM | POA: Diagnosis not present

## 2018-12-27 DIAGNOSIS — I10 Essential (primary) hypertension: Secondary | ICD-10-CM | POA: Diagnosis not present

## 2018-12-27 DIAGNOSIS — Z0001 Encounter for general adult medical examination with abnormal findings: Secondary | ICD-10-CM | POA: Diagnosis not present

## 2019-06-29 ENCOUNTER — Other Ambulatory Visit: Payer: Self-pay

## 2019-06-29 DIAGNOSIS — Z20822 Contact with and (suspected) exposure to covid-19: Secondary | ICD-10-CM

## 2019-06-30 LAB — NOVEL CORONAVIRUS, NAA: SARS-CoV-2, NAA: NOT DETECTED

## 2021-09-02 DIAGNOSIS — H2513 Age-related nuclear cataract, bilateral: Secondary | ICD-10-CM | POA: Insufficient documentation

## 2021-09-02 DIAGNOSIS — H353131 Nonexudative age-related macular degeneration, bilateral, early dry stage: Secondary | ICD-10-CM | POA: Insufficient documentation

## 2021-11-09 ENCOUNTER — Encounter: Payer: Self-pay | Admitting: Orthopedic Surgery

## 2021-11-09 ENCOUNTER — Ambulatory Visit (INDEPENDENT_AMBULATORY_CARE_PROVIDER_SITE_OTHER): Payer: Medicare Other | Admitting: Orthopedic Surgery

## 2021-11-09 ENCOUNTER — Other Ambulatory Visit: Payer: Self-pay

## 2021-11-09 ENCOUNTER — Ambulatory Visit: Payer: Medicare Other

## 2021-11-09 VITALS — BP 143/89 | HR 89 | Ht 71.0 in | Wt 250.0 lb

## 2021-11-09 DIAGNOSIS — D3613 Benign neoplasm of peripheral nerves and autonomic nervous system of lower limb, including hip: Secondary | ICD-10-CM | POA: Diagnosis not present

## 2021-11-09 DIAGNOSIS — M25571 Pain in right ankle and joints of right foot: Secondary | ICD-10-CM

## 2021-11-09 NOTE — Progress Notes (Signed)
Chief Complaint  Patient presents with   Ankle Pain    RT ankle// painful off and on since knee replacements    HPI: 66 YO male p/w a mass or knot over the medial side of the rigth ankle which is sore and occasionally painful   This area will slightly become discolored  Past Medical History:  Diagnosis Date   Dyslipidemia    Family history of heart disease    father died @ 19 of HF   Hypertension    OA (osteoarthritis)    Obesity     BP (!) 143/89    Pulse 89    Ht 5\' 11"  (1.803 m)    Wt 250 lb (113.4 kg)    BMI 34.87 kg/m    General appearance: Well-developed well-nourished no gross deformities  Cardiovascular normal pulse and perfusion normal color without edema  Neurologically no sensation loss or deficits or pathologic reflexes  Psychological: Awake alert and oriented x3 mood and affect normal  Skin no lacerations or ulcerations no nodularity no palpable masses, no erythema or nodularity  Musculoskeletal: right ankle  The swelling is over the medial mall w/ no tenderness over the PTT Ankle motion is normal  It is slightly discolored   Imaging images of the ankle with a marker were negative  A/P  Encounter Diagnoses  Name Primary?   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

## 2022-01-25 ENCOUNTER — Telehealth: Payer: Self-pay | Admitting: Orthopedic Surgery

## 2022-01-25 NOTE — Telephone Encounter (Signed)
Schedule excision mass right ankle for a fri 730 in July

## 2022-01-25 NOTE — Telephone Encounter (Signed)
Please advise 

## 2022-01-25 NOTE — Telephone Encounter (Signed)
Patient called, relaying, per visit 11/09/21, that he would like to schedule surgery for his right ankle - said would like to have it in July.  I offered appointment to come to office for re-evaluation and discussion of surgery. Patient states just please have Dr Aline Brochure call me". Relayed I will send it to Dr Harrison's clinic assistant. ?

## 2022-01-26 ENCOUNTER — Other Ambulatory Visit: Payer: Self-pay

## 2022-01-26 DIAGNOSIS — D3613 Benign neoplasm of peripheral nerves and autonomic nervous system of lower limb, including hip: Secondary | ICD-10-CM

## 2022-01-26 DIAGNOSIS — Z01818 Encounter for other preprocedural examination: Secondary | ICD-10-CM

## 2022-01-26 NOTE — Telephone Encounter (Signed)
Spoke with patient. Decided on 03/26/22. Patient is in agreement with surgery plan. Understands that AP will call to schedule him for PAT and go over everything with him. Encouraged him to call with any questions/concerns he may have. Advised him if for some reason sx date needs to change to please let us know in time to change everything. Verbalized understanding.  ?

## 2022-03-03 ENCOUNTER — Telehealth: Payer: Self-pay | Admitting: Radiology

## 2022-03-03 NOTE — Telephone Encounter (Signed)
Notification or Prior Authorization is not required for the requested services  This [employer] members plan does not currently require a prior authorization for these services.   CPT [phone] Mass excision

## 2022-03-22 NOTE — Patient Instructions (Signed)
[name]  03/22/2022     '@[email or web address]'$ @   Your procedure is scheduled on  03/26/2022.   Report to [facility] at  0600  A.M.   Call this number if you have problems the morning of surgery:  [phone]   Remember:  Do not eat or drink after midnight.      Take these medicines the morning of surgery with A SIP OF WATER                                      claritin    Do not wear jewelry, make-up or nail polish.  Do not wear lotions, powders, or perfumes, or deodorant.  Do not shave 48 hours prior to surgery.  Men may shave face and neck.  Do not bring valuables to the hospital.  [facility] is not responsible for any belongings or valuables.  Contacts, dentures or bridgework may not be worn into surgery.  Leave your suitcase in the car.  After surgery it may be brought to your room.  For patients admitted to the hospital, discharge time will be determined by your treatment team.  Patients discharged the day of surgery will not be allowed to drive home and must have someone with them for.    Special instructions:   DO NOT smoke tobacco or vape for 24 hours before your procedure.  Please read over the following fact sheets that you were given. Coughing and Deep Breathing, Surgical Site Infection Prevention, Anesthesia Post-op Instructions, and Care and Recovery After Surgery      Epidermoid Cyst Removal, Care After This sheet gives you information about how to care for yourself after your procedure. Your health care provider may also give you more specific instructions. If you have problems or questions, contact your health care provider. What can I expect after the procedure? After the procedure, it is common to have: Soreness in the area where your cyst was removed. Tightness or itchiness from the stitches (sutures) in your skin. Follow these instructions at home: Medicines Take over-the-counter and prescription medicines only as told by  your health care provider. If you were prescribed an antibiotic medicine or ointment, take or apply it as told by your health care provider. Do not stop using the antibiotic even if you start to feel better. Incision care  Follow instructions from your health care provider about how to take care of your incision. Make sure you: Wash your hands with soap and water for at least 20 seconds before you change your bandage (dressing). If soap and water are not available, use hand sanitizer. Change your dressing as told by your health care provider. Leave sutures, skin glue, or adhesive strips in place. These skin closures may need to stay in place for 1-2 weeks or longer. If adhesive strip edges start to loosen and curl up, you may trim the loose edges. Do not remove adhesive strips completely unless your health care provider tells you to do that. Keep the dressing dry until your health care provider says that it can be removed. After your dressing is off, check your incision area every day for signs of infection. Check for: Redness, swelling, or pain. Fluid or blood. Warmth. Pus or a bad smell. General instructions Do not take baths, swim, or use a hot tub until your health care provider approves. Ask your  health care provider if you may take showers. You may only be allowed to take sponge baths. Your health care provider may ask you to avoid contact sports or activities that take a lot of effort. Do not do anything that stretches or puts pressure on your incision. You can return to your normal diet. Keep all follow-up visits. This is important. Contact a health care provider if: You have a fever. You have redness, swelling, or pain in the incision area. You have fluid or blood coming from your incision. You have pus or a bad smell coming from your incision. Your incision feels warm to the touch. Your cyst grows back. Get help right away if: If the incision site suddenly increases in size and  you have pain at the incision site. You may be checked for a collection of blood under the skin from the procedure (hematoma). Summary After the procedure, it is common to have soreness in the area where your cyst was removed. Take or apply over-the-counter and prescription medicines only as told by your health care provider. Follow instructions from your health care provider about how to take care of your incision. This information is not intended to replace advice given to you by your health care provider. Make sure you discuss any questions you have with your health care provider. Document Revised: 12/05/2019 Document Reviewed: 12/05/2019 Elsevier Patient Education  [place] Anesthesia, Adult, Care After This sheet gives you information about how to care for yourself after your procedure. Your health care provider may also give you more specific instructions. If you have problems or questions, contact your health care provider. What can I expect after the procedure? After the procedure, the following side effects are common: Pain or discomfort at the IV site. Nausea. Vomiting. Sore throat. Trouble concentrating. Feeling cold or chills. Feeling weak or tired. Sleepiness and fatigue. Soreness and body aches. These side effects can affect parts of the body that were not involved in surgery. Follow these instructions at home: For the time period you were told by your health care provider:  Rest. Do not participate in activities where you could fall or become injured. Do not drive or use machinery. Do not drink alcohol. Do not take sleeping pills or medicines that cause drowsiness. Do not make important decisions or sign legal documents. Do not take care of children on your own. Eating and drinking Follow any instructions from your health care provider about eating or drinking restrictions. When you feel hungry, start by eating small amounts of foods that are soft  and easy to digest (bland), such as toast. Gradually return to your regular diet. Drink enough fluid to keep your urine pale yellow. If you vomit, rehydrate by drinking water, juice, or clear broth. General instructions If you have sleep apnea, surgery and certain medicines can increase your risk for breathing problems. Follow instructions from your health care provider about wearing your sleep device: Anytime you are sleeping, including during daytime naps. While taking prescription pain medicines, sleeping medicines, or medicines that make you drowsy. Have a responsible adult stay with you for the time you are told. It is important to have someone help care for you until you are awake and alert. Return to your normal activities as told by your health care provider. Ask your health care provider what activities are safe for you. Take over-the-counter and prescription medicines only as told by your health care provider. If you smoke, do not smoke without supervision. Keep  all follow-up visits as told by your health care provider. This is important. Contact a health care provider if: You have nausea or vomiting that does not get better with medicine. You cannot eat or drink without vomiting. You have pain that does not get better with medicine. You are unable to pass urine. You develop a skin rash. You have a fever. You have redness around your IV site that gets worse. Get help right away if: You have difficulty breathing. You have chest pain. You have blood in your urine or stool, or you vomit blood. Summary After the procedure, it is common to have a sore throat or nausea. It is also common to feel tired. Have a responsible adult stay with you for the time you are told. It is important to have someone help care for you until you are awake and alert. When you feel hungry, start by eating small amounts of foods that are soft and easy to digest (bland), such as toast. Gradually return to  your regular diet. Drink enough fluid to keep your urine pale yellow. Return to your normal activities as told by your health care provider. Ask your health care provider what activities are safe for you. This information is not intended to replace advice given to you by your health care provider. Make sure you discuss any questions you have with your health care provider. Document Revised: 05/15/2020 Document Reviewed: 12/13/2019 Elsevier Patient Education  [place]. How to Use Chlorhexidine for Bathing Chlorhexidine gluconate (CHG) is a germ-killing (antiseptic) solution that is used to clean the skin. It can get rid of the bacteria that normally live on the skin and can keep them away for about 24 hours. To clean your skin with CHG, you may be given: A CHG solution to use in the shower or as part of a sponge bath. A prepackaged cloth that contains CHG. Cleaning your skin with CHG may help lower the risk for infection: While you are staying in the intensive care unit of the hospital. If you have a vascular access, such as a central line, to provide short-term or long-term access to your veins. If you have a catheter to drain urine from your bladder. If you are on a ventilator. A ventilator is a machine that helps you breathe by moving air in and out of your lungs. After surgery. What are the risks? Risks of using CHG include: A skin reaction. Hearing loss, if CHG gets in your ears and you have a perforated eardrum. Eye injury, if CHG gets in your eyes and is not rinsed out. The CHG product catching fire. Make sure that you avoid smoking and flames after applying CHG to your skin. Do not use CHG: If you have a chlorhexidine allergy or have previously reacted to chlorhexidine. On babies younger than 55 months of age. How to use CHG solution Use CHG only as told by your health care provider, and follow the instructions on the label. Use the full amount of CHG as directed. Usually,  this is one bottle. During a shower Follow these steps when using CHG solution during a shower (unless your health care provider gives you different instructions): Start the shower. Use your normal soap and shampoo to wash your face and hair. Turn off the shower or move out of the shower stream. Pour the CHG onto a clean washcloth. Do not use any type of brush or rough-edged sponge. Starting at your neck, lather your body down to your toes.  Make sure you follow these instructions: If you will be having surgery, pay special attention to the part of your body where you will be having surgery. Scrub this area for at least 1 minute. Do not use CHG on your head or face. If the solution gets into your ears or eyes, rinse them well with water. Avoid your genital area. Avoid any areas of skin that have broken skin, cuts, or scrapes. Scrub your back and under your arms. Make sure to wash skin folds. Let the lather sit on your skin for 1-2 minutes or as long as told by your health care provider. Thoroughly rinse your entire body in the shower. Make sure that all body creases and crevices are rinsed well. Dry off with a clean towel. Do not put any substances on your body afterward--such as powder, lotion, or perfume--unless you are told to do so by your health care provider. Only use lotions that are recommended by the manufacturer. Put on clean clothes or pajamas. If it is the night before your surgery, sleep in clean sheets.  During a sponge bath Follow these steps when using CHG solution during a sponge bath (unless your health care provider gives you different instructions): Use your normal soap and shampoo to wash your face and hair. Pour the CHG onto a clean washcloth. Starting at your neck, lather your body down to your toes. Make sure you follow these instructions: If you will be having surgery, pay special attention to the part of your body where you will be having surgery. Scrub this area for  at least 1 minute. Do not use CHG on your head or face. If the solution gets into your ears or eyes, rinse them well with water. Avoid your genital area. Avoid any areas of skin that have broken skin, cuts, or scrapes. Scrub your back and under your arms. Make sure to wash skin folds. Let the lather sit on your skin for 1-2 minutes or as long as told by your health care provider. Using a different clean, wet washcloth, thoroughly rinse your entire body. Make sure that all body creases and crevices are rinsed well. Dry off with a clean towel. Do not put any substances on your body afterward--such as powder, lotion, or perfume--unless you are told to do so by your health care provider. Only use lotions that are recommended by the manufacturer. Put on clean clothes or pajamas. If it is the night before your surgery, sleep in clean sheets. How to use CHG prepackaged cloths Only use CHG cloths as told by your health care provider, and follow the instructions on the label. Use the CHG cloth on clean, dry skin. Do not use the CHG cloth on your head or face unless your health care provider tells you to. When washing with the CHG cloth: Avoid your genital area. Avoid any areas of skin that have broken skin, cuts, or scrapes. Before surgery Follow these steps when using a CHG cloth to clean before surgery (unless your health care provider gives you different instructions): Using the CHG cloth, vigorously scrub the part of your body where you will be having surgery. Scrub using a back-and-forth motion for 3 minutes. The area on your body should be completely wet with CHG when you are done scrubbing. Do not rinse. Discard the cloth and let the area air-dry. Do not put any substances on the area afterward, such as powder, lotion, or perfume. Put on clean clothes or pajamas. If it is the  night before your surgery, sleep in clean sheets.  For general bathing Follow these steps when using CHG cloths for  general bathing (unless your health care provider gives you different instructions). Use a separate CHG cloth for each area of your body. Make sure you wash between any folds of skin and between your fingers and toes. Wash your body in the following order, switching to a new cloth after each step: The front of your neck, shoulders, and chest. Both of your arms, under your arms, and your hands. Your stomach and groin area, avoiding the genitals. Your right leg and foot. Your left leg and foot. The back of your neck, your back, and your buttocks. Do not rinse. Discard the cloth and let the area air-dry. Do not put any substances on your body afterward--such as powder, lotion, or perfume--unless you are told to do so by your health care provider. Only use lotions that are recommended by the manufacturer. Put on clean clothes or pajamas. Contact a health care provider if: Your skin gets irritated after scrubbing. You have questions about using your solution or cloth. You swallow any chlorhexidine. Call your local poison control center (1-434-448-6397 in the [place] Get help right away if: Your eyes itch badly, or they become very red or swollen. Your skin itches badly and is red or swollen. Your hearing changes. You have trouble seeing. You have swelling or tingling in your mouth or throat. You have trouble breathing. These symptoms may represent a serious problem that is an emergency. Do not wait to see if the symptoms will go away. Get medical help right away. Call your local emergency services (911 in the [place] Do not drive yourself to the hospital. Summary Chlorhexidine gluconate (CHG) is a germ-killing (antiseptic) solution that is used to clean the skin. Cleaning your skin with CHG may help to lower your risk for infection. You may be given CHG to use for bathing. It may be in a bottle or in a prepackaged cloth to use on your skin. Carefully follow your health care provider's instructions  and the instructions on the product label. Do not use CHG if you have a chlorhexidine allergy. Contact your health care provider if your skin gets irritated after scrubbing. This information is not intended to replace advice given to you by your health care provider. Make sure you discuss any questions you have with your health care provider. Document Revised: 11/10/2020 Document Reviewed: 11/10/2020 Elsevier Patient Education  [place].

## 2022-03-23 ENCOUNTER — Other Ambulatory Visit: Payer: Self-pay | Admitting: Orthopedic Surgery

## 2022-03-23 ENCOUNTER — Telehealth: Payer: Self-pay | Admitting: Radiology

## 2022-03-23 DIAGNOSIS — Z01818 Encounter for other preprocedural examination: Secondary | ICD-10-CM

## 2022-03-23 DIAGNOSIS — R2241 Localized swelling, mass and lump, right lower limb: Secondary | ICD-10-CM

## 2022-03-23 DIAGNOSIS — I10 Essential (primary) hypertension: Secondary | ICD-10-CM

## 2022-03-23 NOTE — Telephone Encounter (Signed)
Orders only right ankle mass excision

## 2022-03-24 ENCOUNTER — Encounter (HOSPITAL_COMMUNITY): Payer: Self-pay

## 2022-03-24 ENCOUNTER — Encounter (HOSPITAL_COMMUNITY)
Admission: RE | Admit: 2022-03-24 | Discharge: 2022-03-24 | Disposition: A | Discharge status: Home / Self Care | Payer: Medicare Other | Source: Ambulatory Visit | Attending: Orthopedic Surgery | Admitting: Orthopedic Surgery

## 2022-03-24 ENCOUNTER — Other Ambulatory Visit: Payer: Self-pay

## 2022-03-24 VITALS — BP 142/89 | HR 71 | Temp 97.9°F | Resp 18 | Ht 71.0 in | Wt 231.0 lb

## 2022-03-24 DIAGNOSIS — I10 Essential (primary) hypertension: Secondary | ICD-10-CM | POA: Diagnosis not present

## 2022-03-24 DIAGNOSIS — Z01818 Encounter for other preprocedural examination: Secondary | ICD-10-CM | POA: Diagnosis present

## 2022-03-24 DIAGNOSIS — R2241 Localized swelling, mass and lump, right lower limb: Secondary | ICD-10-CM

## 2022-03-24 LAB — BASIC METABOLIC PANEL
Anion gap: 7 (ref 5–15)
BUN: 15 mg/dL (ref 8–23)
CO2: 23 mmol/L (ref 22–32)
Calcium: 9 mg/dL (ref 8.9–10.3)
Chloride: 108 mmol/L (ref 98–111)
Creatinine, Ser: 0.88 mg/dL (ref 0.61–1.24)
GFR, Estimated: >60 mL/min (ref >60)
Glucose, Bld: 116 mg/dL — ABNORMAL HIGH (ref 70–99)
Potassium: 4 mmol/L (ref 3.5–5.1)
Sodium: 138 mmol/L (ref 135–145)

## 2022-03-24 LAB — CBC WITH DIFFERENTIAL/PLATELET
Abs Immature Granulocytes: 0.02 10*3/uL (ref 0.00–0.07)
Basophils Absolute: 0.1 10*3/uL (ref 0.0–0.1)
Basophils Relative: 1 %
Eosinophils Absolute: 0.2 10*3/uL (ref 0.0–0.5)
Eosinophils Relative: 4 %
HCT: 41.5 % (ref 39.0–52.0)
Hemoglobin: 14 g/dL (ref 13.0–17.0)
Immature Granulocytes: 0 %
Lymphocytes Relative: 26 %
Lymphs Abs: 1.6 10*3/uL (ref 0.7–4.0)
MCH: 31.5 pg (ref 26.0–34.0)
MCHC: 33.7 g/dL (ref 30.0–36.0)
MCV: 93.3 fL (ref 80.0–100.0)
Monocytes Absolute: 0.5 10*3/uL (ref 0.1–1.0)
Monocytes Relative: 8 %
Neutro Abs: 3.7 10*3/uL (ref 1.7–7.7)
Neutrophils Relative %: 61 %
Platelets: 233 10*3/uL (ref 150–400)
RBC: 4.45 MIL/uL (ref 4.22–5.81)
RDW: 13.5 % (ref 11.5–15.5)
WBC: 6.1 10*3/uL (ref 4.0–10.5)
nRBC: 0 % (ref 0.0–0.2)

## 2022-03-24 NOTE — Progress Notes (Signed)
   03/24/22 0819  OBSTRUCTIVE SLEEP APNEA  Have you ever been diagnosed with sleep apnea through a sleep study? No  Do you snore loudly (loud enough to be heard through closed doors)?  1  Do you often feel tired, fatigued, or sleepy during the daytime (such as falling asleep during driving or talking to someone)? 0  Has anyone observed you stop breathing during your sleep? 0  Do you have, or are you being treated for high blood pressure? 1  BMI more than 35 kg/m2? 0  Age > 50 (1-yes) 1  Neck circumference greater than:Male 16 inches or larger, Male 17inches or larger? 0  Male Gender (Yes=1) 1  Obstructive Sleep Apnea Score 4  Score 5 or greater  Results sent to PCP

## 2022-03-26 ENCOUNTER — Encounter (HOSPITAL_COMMUNITY)
Admission: RE | Disposition: A | Discharge status: Home / Self Care | Payer: Self-pay | Source: Ambulatory Visit | Attending: Orthopedic Surgery

## 2022-03-26 ENCOUNTER — Other Ambulatory Visit: Payer: Self-pay

## 2022-03-26 ENCOUNTER — Ambulatory Visit (HOSPITAL_BASED_OUTPATIENT_CLINIC_OR_DEPARTMENT_OTHER): Payer: Medicare Other | Admitting: Certified Registered"

## 2022-03-26 ENCOUNTER — Telehealth: Payer: Self-pay | Admitting: Orthopedic Surgery

## 2022-03-26 ENCOUNTER — Encounter (HOSPITAL_COMMUNITY): Payer: Self-pay | Admitting: Orthopedic Surgery

## 2022-03-26 ENCOUNTER — Ambulatory Visit (HOSPITAL_COMMUNITY)
Admission: RE | Admit: 2022-03-26 | Discharge: 2022-03-26 | Disposition: A | Discharge status: Home / Self Care | Payer: Medicare Other | Source: Ambulatory Visit | Attending: Orthopedic Surgery | Admitting: Orthopedic Surgery

## 2022-03-26 ENCOUNTER — Ambulatory Visit (HOSPITAL_COMMUNITY): Payer: Medicare Other | Admitting: Certified Registered"

## 2022-03-26 DIAGNOSIS — I10 Essential (primary) hypertension: Secondary | ICD-10-CM | POA: Diagnosis not present

## 2022-03-26 DIAGNOSIS — M1990 Unspecified osteoarthritis, unspecified site: Secondary | ICD-10-CM | POA: Diagnosis not present

## 2022-03-26 DIAGNOSIS — I868 Varicose veins of other specified sites: Secondary | ICD-10-CM | POA: Diagnosis not present

## 2022-03-26 DIAGNOSIS — F17210 Nicotine dependence, cigarettes, uncomplicated: Secondary | ICD-10-CM | POA: Diagnosis not present

## 2022-03-26 DIAGNOSIS — M25571 Pain in right ankle and joints of right foot: Secondary | ICD-10-CM | POA: Diagnosis not present

## 2022-03-26 DIAGNOSIS — D1723 Benign lipomatous neoplasm of skin and subcutaneous tissue of right leg: Secondary | ICD-10-CM

## 2022-03-26 HISTORY — PX: EXCISION MASS LOWER EXTREMETIES: SHX6705

## 2022-03-26 SURGERY — EXCISION MASS LOWER EXTREMITIES
Anesthesia: General | Site: Ankle | Laterality: Right

## 2022-03-26 SURGERY — EXCISION MASS LOWER EXTREMITIES
Anesthesia: Choice | Laterality: Right

## 2022-03-26 MED ORDER — LIDOCAINE HCL (PF) 2 % IJ SOLN
INTRAMUSCULAR | Status: AC
  Filled 2022-03-26: qty 5

## 2022-03-26 MED ORDER — FENTANYL CITRATE (PF) 100 MCG/2ML IJ SOLN
INTRAMUSCULAR | Status: DC | PRN
  Administered 2022-03-26: 100 ug via INTRAVENOUS
  Administered 2022-03-26: 25 ug via INTRAVENOUS

## 2022-03-26 MED ORDER — CEFAZOLIN SODIUM-DEXTROSE 2-4 GM/100ML-% IV SOLN
INTRAVENOUS | Status: AC
  Filled 2022-03-26: qty 100

## 2022-03-26 MED ORDER — LIDOCAINE HCL (CARDIAC) PF 100 MG/5ML IV SOSY
PREFILLED_SYRINGE | INTRAVENOUS | Status: DC | PRN
  Administered 2022-03-26: 50 mg via INTRAVENOUS

## 2022-03-26 MED ORDER — METOPROLOL TARTRATE 5 MG/5ML IV SOLN
INTRAVENOUS | Status: AC
  Filled 2022-03-26: qty 5

## 2022-03-26 MED ORDER — PROPOFOL 10 MG/ML IV BOLUS
INTRAVENOUS | Status: AC
  Filled 2022-03-26: qty 20

## 2022-03-26 MED ORDER — FENTANYL CITRATE (PF) 100 MCG/2ML IJ SOLN
INTRAMUSCULAR | Status: AC
  Filled 2022-03-26: qty 2

## 2022-03-26 MED ORDER — 0.9 % SODIUM CHLORIDE (POUR BTL) OPTIME
TOPICAL | Status: DC | PRN
  Administered 2022-03-26: 1000 mL

## 2022-03-26 MED ORDER — CEFAZOLIN SODIUM-DEXTROSE 2-4 GM/100ML-% IV SOLN
2.0000 g | INTRAVENOUS | Status: AC
  Administered 2022-03-26: 2 g via INTRAVENOUS

## 2022-03-26 MED ORDER — ORAL CARE MOUTH RINSE: 15.0000 mL | Freq: Once | OROMUCOSAL | Status: AC

## 2022-03-26 MED ORDER — BUPIVACAINE-EPINEPHRINE (PF) 0.5% -1:200000 IJ SOLN
INTRAMUSCULAR | Status: DC | PRN
  Administered 2022-03-26: 15 mL

## 2022-03-26 MED ORDER — ONDANSETRON HCL 4 MG/2ML IJ SOLN
INTRAMUSCULAR | Status: DC | PRN
  Administered 2022-03-26: 4 mg via INTRAVENOUS

## 2022-03-26 MED ORDER — PROPOFOL 10 MG/ML IV BOLUS
INTRAVENOUS | Status: DC | PRN
  Administered 2022-03-26: 200 mg via INTRAVENOUS

## 2022-03-26 MED ORDER — CHLORHEXIDINE GLUCONATE 0.12 % MT SOLN
15.0000 mL | Freq: Once | OROMUCOSAL | Status: AC
  Administered 2022-03-26: 15 mL via OROMUCOSAL

## 2022-03-26 MED ORDER — BUPIVACAINE-EPINEPHRINE (PF) 0.5% -1:200000 IJ SOLN
INTRAMUSCULAR | Status: AC
  Filled 2022-03-26: qty 30

## 2022-03-26 MED ORDER — LACTATED RINGERS IV SOLN: INTRAVENOUS | Status: DC

## 2022-03-26 MED ORDER — ONDANSETRON HCL 4 MG/2ML IJ SOLN
INTRAMUSCULAR | Status: AC
  Filled 2022-03-26: qty 4

## 2022-03-26 MED ORDER — CHLORHEXIDINE GLUCONATE 0.12 % MT SOLN
OROMUCOSAL | Status: AC
  Filled 2022-03-26: qty 15

## 2022-03-26 MED ORDER — EPHEDRINE SULFATE (PRESSORS) 50 MG/ML IJ SOLN
INTRAMUSCULAR | Status: DC | PRN
  Administered 2022-03-26 (×2): 5 mg via INTRAVENOUS
  Administered 2022-03-26: 10 mg via INTRAVENOUS

## 2022-03-26 MED ORDER — EPHEDRINE 5 MG/ML INJ
INTRAVENOUS | Status: AC
  Filled 2022-03-26: qty 5

## 2022-03-26 SURGICAL SUPPLY — 51 items
APL PRP STRL LF DISP 70% ISPRP (MISCELLANEOUS) ×1
APL SKNCLS STERI-STRIP NONHPOA (GAUZE/BANDAGES/DRESSINGS) ×1
BANDAGE ELASTIC 4 VELCRO NS (GAUZE/BANDAGES/DRESSINGS) ×1 IMPLANT
BANDAGE ESMARK 4X12 BL STRL LF (DISPOSABLE) ×1 IMPLANT
BANDAGE ESMARK 6X9 LF (GAUZE/BANDAGES/DRESSINGS) IMPLANT
BENZOIN TINCTURE PRP APPL 2/3 (GAUZE/BANDAGES/DRESSINGS) ×1 IMPLANT
BLADE SURG 15 STRL LF DISP TIS (BLADE) IMPLANT
BLADE SURG 15 STRL SS (BLADE)
BLADE SURG SZ10 CARB STEEL (BLADE) ×1 IMPLANT
BNDG CMPR 12X4 ELC STRL LF (DISPOSABLE)
BNDG CMPR 9X6 STRL LF SNTH (GAUZE/BANDAGES/DRESSINGS) ×1
BNDG COHESIVE 4X5 TAN STRL (GAUZE/BANDAGES/DRESSINGS) ×2 IMPLANT
BNDG ESMARK 4X12 BLUE STRL LF (DISPOSABLE)
BNDG ESMARK 6X9 LF (GAUZE/BANDAGES/DRESSINGS) ×2
BNDG GAUZE ELAST 4 BULKY (GAUZE/BANDAGES/DRESSINGS) ×2 IMPLANT
CHLORAPREP W/TINT 26 (MISCELLANEOUS) ×2 IMPLANT
CLOSURE STERI STRIP 1/2 X4 (GAUZE/BANDAGES/DRESSINGS) ×1 IMPLANT
CLOTH BEACON ORANGE TIMEOUT ST (SAFETY) ×2 IMPLANT
COVER LIGHT HANDLE STERIS (MISCELLANEOUS) ×4 IMPLANT
CUFF TOURN SGL QUICK 34 (TOURNIQUET CUFF) ×2
CUFF TRNQT CYL 34X4.125X (TOURNIQUET CUFF) ×1 IMPLANT
ELECT REM PT RETURN 9FT ADLT (ELECTROSURGICAL) ×2
ELECTRODE REM PT RTRN 9FT ADLT (ELECTROSURGICAL) ×1 IMPLANT
GAUZE SPONGE 4X4 12PLY STRL (GAUZE/BANDAGES/DRESSINGS) ×2 IMPLANT
GAUZE XEROFORM 5X9 LF (GAUZE/BANDAGES/DRESSINGS) ×2 IMPLANT
GLOVE BIOGEL PI IND STRL 7.0 (GLOVE) ×2 IMPLANT
GLOVE BIOGEL PI IND STRL 8.5 (GLOVE) IMPLANT
GLOVE BIOGEL PI INDICATOR 7.0 (GLOVE) ×3
GLOVE BIOGEL PI INDICATOR 8.5 (GLOVE) ×1
GLOVE SKINSENSE NS SZ8.0 LF (GLOVE) ×1
GLOVE SKINSENSE STRL SZ8.0 LF (GLOVE) IMPLANT
GLOVE SS N UNI LF 8.5 STRL (GLOVE) ×2 IMPLANT
GLOVE SURG POLYISO LF SZ8 (GLOVE) ×2 IMPLANT
GOWN STRL REUS W/TWL LRG LVL3 (GOWN DISPOSABLE) ×2 IMPLANT
GOWN STRL REUS W/TWL XL LVL3 (GOWN DISPOSABLE) ×2 IMPLANT
INST SET MINOR BONE (KITS) ×2 IMPLANT
KIT TURNOVER KIT A (KITS) ×2 IMPLANT
MANIFOLD NEPTUNE II (INSTRUMENTS) ×2 IMPLANT
NDL HYPO 21X1.5 SAFETY (NEEDLE) ×1 IMPLANT
NEEDLE HYPO 21X1.5 SAFETY (NEEDLE) ×2 IMPLANT
NS IRRIG 1000ML POUR BTL (IV SOLUTION) ×2 IMPLANT
PACK BASIC LIMB (CUSTOM PROCEDURE TRAY) ×2 IMPLANT
PAD ABD 5X9 TENDERSORB (GAUZE/BANDAGES/DRESSINGS) ×1 IMPLANT
PAD ARMBOARD 7.5X6 YLW CONV (MISCELLANEOUS) ×2 IMPLANT
SET BASIN LINEN APH (SET/KITS/TRAYS/PACK) ×2 IMPLANT
SPONGE T-LAP 18X18 ~~LOC~~+RFID (SPONGE) ×2 IMPLANT
SUT MON AB 2-0 SH 27 (SUTURE) ×2
SUT MON AB 2-0 SH27 (SUTURE) IMPLANT
SYR 30ML LL (SYRINGE) ×2 IMPLANT
SYR BULB IRRIG 60ML STRL (SYRINGE) ×2 IMPLANT
WATER STERILE IRR 1000ML POUR (IV SOLUTION) ×1 IMPLANT

## 2022-03-26 NOTE — Anesthesia Postprocedure Evaluation (Signed)
Anesthesia Post Note  Patient: [name]  Procedure(s) Performed: EXCISION MASS RIGHT LOWER EXTREMITY (Right: Ankle)  Patient location during evaluation: PACU Anesthesia Type: General Level of consciousness: awake and alert Pain management: pain level controlled Vital Signs Assessment: post-procedure vital signs reviewed and stable Respiratory status: spontaneous breathing, nonlabored ventilation, respiratory function stable and patient connected to nasal cannula oxygen Cardiovascular status: blood pressure returned to baseline and stable Postop Assessment: no apparent nausea or vomiting Anesthetic complications: no   There were no known notable events for this encounter.   Last Vitals:  Vitals:   03/26/22 0845 03/26/22 0851  BP: 131/72 133/83  Pulse: 74 66  Resp: 16 16  Temp:  36.7 C  SpO2: 93% 97%    Last Pain:  Vitals:   03/26/22 0851  TempSrc: Oral  PainSc: 0-No pain                 Trixie Rude

## 2022-03-26 NOTE — Telephone Encounter (Signed)
[facility] office called patient per Dr Ruthe Mannan message; reached voicemail; left message to call regarding scheduling appointment for Monday, 03/29/22.

## 2022-03-26 NOTE — Brief Op Note (Signed)
03/26/2022  8:31 AM  PATIENT:  [name]  66 y.o. male  PRE-OPERATIVE DIAGNOSIS:  neuroma right ankle  POST-OPERATIVE DIAGNOSIS: Lipoma, Dilation of saphenous vein  PROCEDURE:  Procedure(s): EXCISION MASS RIGHT LOWER EXTREMITY (Right)  Surgical findings there was thickened subcutaneous tissue consistent with possible lipoma.  However, there was also a dilated saphenous vein.  I am thinking that the vein is in gorging when he is up on his feet and pushing the fat tissue in front of it towards the skin causing this mass effect  SURGEON:  Surgeon(s) and Role:    * Carole Civil, MD - Primary  PHYSICIAN ASSISTANT:   ASSISTANTS: none   ANESTHESIA:   none  EBL:  15 mL   BLOOD ADMINISTERED:none  DRAINS: none   LOCAL MEDICATIONS USED:  MARCAINE     SPECIMEN:  No Specimen  DISPOSITION OF SPECIMEN:  N/A  COUNTS:  YES  TOURNIQUET:   Total Tourniquet Time Documented: Thigh (Right) - 28 minutes Total: Thigh (Right) - 28 minutes   DICTATION: .Viviann Spare Dictation  PLAN OF CARE: Discharge to home after PACU  PATIENT DISPOSITION:  PACU - hemodynamically stable.   Delay start of Pharmacological VTE agent (>24hrs) due to surgical blood loss or risk of bleeding: not applicable  Details of procedure after thorough review of the patient in the preop area the surgical site was confirmed as right ankle  We discussed his most recent clinical picture he says he had had swelling in about a month.  He had no pain in the area but the mass still concerned him and we decided to go ahead with the procedure in order to avoid coming back at a later date  After he was brought to the operating room for general anesthesia his right leg was prepped and draped sterilely Timeout was completed  I made a longitudinal incision over the area in question.  I divided subcutaneous tissue and there was an area of fat collected in this area.  However I was more concerned about the dilated  saphenous vein.  In my opinion the dilated saphenous vein was pushing the subcutaneous fat forward making this appear like a mass.  I did not disturb the neuro structures in the area and avoided further dissection and compromise of the saphenous vein irrigated and closed with 2 layers of 0 Monocryl the last and the subcuticular layer with benzoin and Steri-Strips  Sterile dressing applied  Patient can ambulate weight-bear as tolerated

## 2022-03-26 NOTE — Telephone Encounter (Signed)
-----   Message from Carole Civil, MD sent at 03/26/2022  8:36 AM EDT ----- I need to see him Monday to change his dressing

## 2022-03-26 NOTE — Interval H&P Note (Signed)
History and Physical Interval Note:  03/26/2022 7:28 AM  [name]  has presented today for surgery, with the diagnosis of neuroma right ankle.  The various methods of treatment have been discussed with the patient and family. After consideration of risks, benefits and other options for treatment, the patient has consented to  Procedure(s): EXCISION MASS LOWER EXTREMETIES (Right) as a surgical intervention.  The patient's history has been reviewed, patient examined, no change in status, stable for surgery.  I have reviewed the patient's chart and labs.  Questions were answered to the patient's satisfaction.     Arther Abbott

## 2022-03-26 NOTE — Brief Op Note (Signed)
03/26/2022  8:28 AM  PATIENT:  [name]  66 y.o. male  PRE-OPERATIVE DIAGNOSIS:  neuroma right ankle  POST-OPERATIVE DIAGNOSIS: Lipoma, Dilation of saphenous vein  PROCEDURE:  Procedure(s): EXCISION MASS RIGHT LOWER EXTREMITY (Right)  Surgical findings there was thickened subcutaneous tissue consistent with possible lipoma.  However, there was also a dilated saphenous vein.  I am thinking that the vein is in gorging when he is up on his feet and pushing the fat tissue in front of it towards the skin causing this mass effect  SURGEON:  Surgeon(s) and Role:    * Carole Civil, MD - Primary  PHYSICIAN ASSISTANT:   ASSISTANTS: none   ANESTHESIA:   none  EBL:  15 mL   BLOOD ADMINISTERED:none  DRAINS: none   LOCAL MEDICATIONS USED:  MARCAINE     SPECIMEN:  No Specimen  DISPOSITION OF SPECIMEN:  N/A  COUNTS:  YES  TOURNIQUET:   Total Tourniquet Time Documented: Thigh (Right) - 28 minutes Total: Thigh (Right) - 28 minutes   DICTATION: .Viviann Spare Dictation  PLAN OF CARE: Discharge to home after PACU  PATIENT DISPOSITION:  PACU - hemodynamically stable.   Delay start of Pharmacological VTE agent (>24hrs) due to surgical blood loss or risk of bleeding: not applicable  Details of procedure after thorough review of the patient in the preop area the surgical site was confirmed as right ankle  We discussed his most recent clinical picture he says he had had swelling in about a month.  He had no pain in the area but the mass still concerned him and we decided to go ahead with the procedure in order to avoid coming back at a later date  After he was brought to the operating room for general anesthesia his right leg was prepped and draped sterilely Timeout was completed  I made a longitudinal incision over the area in question.  I divided subcutaneous tissue and there was an area of fat collected in this area.  However I was more concerned about the dilated  saphenous vein.  In my opinion the dilated saphenous vein was pushing the subcutaneous fat forward making this appear like a mass.  I did not disturb the neuro structures in the area and avoided further dissection and compromise of the saphenous vein irrigated and closed with 2 layers of 0 Monocryl the last and the subcuticular layer with benzoin and Steri-Strips  Sterile dressing applied  Patient can ambulate weight-bear as tolerated

## 2022-03-26 NOTE — Anesthesia Procedure Notes (Signed)
Procedure Name: LMA Insertion Date/Time: 03/26/2022 7:39 AM  Performed by: Trixie Rude, MDPre-anesthesia Checklist: Patient identified, Emergency Drugs available, Suction available, Patient being monitored and Timeout performed Patient Re-evaluated:Patient Re-evaluated prior to induction Oxygen Delivery Method: Circle system utilized Preoxygenation: Pre-oxygenation with 100% oxygen Induction Type: IV induction Ventilation: Mask ventilation without difficulty LMA: LMA inserted LMA Size: 4.0 Number of attempts: 1 Tube secured with: Tape Dental Injury: Teeth and Oropharynx as per pre-operative assessment  Comments: Easy atraumatic LMA insertion

## 2022-03-26 NOTE — H&P (Signed)
  Chief Complaint  Patient presents with   Ankle Pain      RT ankle// painful off and on since knee replacements      HPI: 66 YO male p/w a mass or knot over the medial side of the rigth ankle which is sore and occasionally painful    This area will slightly become discolored       Past Medical History:  Diagnosis Date   Dyslipidemia     Family history of heart disease      father died @ 16 of HF   Hypertension     OA (osteoarthritis)     Obesity        Past Surgical History:  Procedure Laterality Date   Bilateral knee replacements  2009   COLONOSCOPY     COLONOSCOPY N/A 04/17/2014   Procedure: COLONOSCOPY;  Surgeon: Rogene Houston, MD;  [facility]: [facility];  Service: Endoscopy;  Laterality: N/A;  730-moved to 42  Ann to notify   Coronary Calcium Score  12/2011   zero coronary calcium   KNEE SURGERY Right 2009, 2010   partial right knee replacement   VASECTOMY  1990   Family History  Problem Relation Age of Onset   Heart failure Father    Diabetes Father    Atrial fibrillation Mother    Colon cancer Neg Hx    Social History   Tobacco Use   Smoking status: Some Days    Packs/day: 0.25    Years: 30.00    Total pack years: 7.50    Types: Cigarettes   Smokeless tobacco: Never  Substance Use Topics   Alcohol use: Yes    Alcohol/week: 24.0 standard drinks of alcohol    Types: 12 Cans of beer, 12 Standard drinks or equivalent per week   Drug use: No    BP (!) 143/89   Pulse 89   Ht '5\' 11"'$  (1.803 m)   Wt 250 lb (113.4 kg)   BMI 34.87 kg/m      General appearance: Well-developed well-nourished no gross deformities  Cardiovascular normal pulse and perfusion normal color without edema  Neurologically no sensation loss or deficits or pathologic reflexes   Psychological: Awake alert and oriented x3 mood and affect normal   Skin no lacerations or ulcerations no nodularity , no erythema or nodularity   Musculoskeletal: right ankle  The swelling is over  the medial mall w/ no tenderness over the PTT Ankle motion is normal  It is slightly discolored    Imaging images of the ankle with a marker were negative   A/P       Encounter Diagnoses  Name Primary?   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 neurofibroma right ankle

## 2022-03-26 NOTE — Anesthesia Preprocedure Evaluation (Signed)
Anesthesia Evaluation  Patient identified by MRN, date of birth, ID band Patient awake    Reviewed: Allergy & Precautions, NPO status , Patient's Chart, lab work & pertinent test results  Airway Mallampati: II  TM Distance: >3 FB Neck ROM: Full    Dental   Pulmonary neg pulmonary ROS, Current Smoker and Patient abstained from smoking.,    Pulmonary exam normal        Cardiovascular Exercise Tolerance: Good METS: 3 - Mets hypertension, negative cardio ROS Normal cardiovascular exam     Neuro/Psych negative neurological ROS     GI/Hepatic negative GI ROS, Neg liver ROS,   Endo/Other  negative endocrine ROS  Renal/GU negative Renal ROS     Musculoskeletal  (+) Arthritis , Mass right ankle   Abdominal Normal abdominal exam  (+)   Peds  Hematology negative hematology ROS (+)   Anesthesia Other Findings   Reproductive/Obstetrics                             Anesthesia Physical Anesthesia Plan  ASA: 2  Anesthesia Plan: General   Post-op Pain Management:    Induction: Intravenous  PONV Risk Score and Plan: 1 and TIVA and Propofol infusion  Airway Management Planned: Nasal Cannula and Natural Airway  Additional Equipment:   Intra-op Plan:   Post-operative Plan:   Informed Consent: I have reviewed the patients History and Physical, chart, labs and discussed the procedure including the risks, benefits and alternatives for the proposed anesthesia with the patient or authorized representative who has indicated his/her understanding and acceptance.     Dental advisory given  Plan Discussed with: CRNA  Anesthesia Plan Comments:         Anesthesia Quick Evaluation

## 2022-03-26 NOTE — Op Note (Signed)
03/26/2022  8:31 AM  PATIENT:  [name]  66 y.o. male  PRE-OPERATIVE DIAGNOSIS:  neuroma right ankle  POST-OPERATIVE DIAGNOSIS: Lipoma, Dilation of saphenous vein  PROCEDURE:  Procedure(s): EXCISION MASS RIGHT LOWER EXTREMITY (Right)  Size of mass 2 cm long by half centimeter wide   Surgical findings there was thickened subcutaneous tissue consistent with possible lipoma.  However, there was also a dilated saphenous vein.  I am thinking that the vein is in gorging when he is up on his feet and pushing the fat tissue in front of it towards the skin causing this mass effect  SURGEON:  Surgeon(s) and Role:    * Carole Civil, MD - Primary  PHYSICIAN ASSISTANT:   ASSISTANTS: none   ANESTHESIA:   none  EBL:  15 mL   BLOOD ADMINISTERED:none  DRAINS: none   LOCAL MEDICATIONS USED:  MARCAINE     SPECIMEN:  No Specimen  DISPOSITION OF SPECIMEN:  N/A  COUNTS:  YES  TOURNIQUET:   Total Tourniquet Time Documented: Thigh (Right) - 28 minutes Total: Thigh (Right) - 28 minutes   DICTATION: .Viviann Spare Dictation  PLAN OF CARE: Discharge to home after PACU  PATIENT DISPOSITION:  PACU - hemodynamically stable.   Delay start of Pharmacological VTE agent (>24hrs) due to surgical blood loss or risk of bleeding: not applicable  Details of procedure after thorough review of the patient in the preop area the surgical site was confirmed as right ankle  We discussed his most recent clinical picture he says he had had swelling in about a month.  He had no pain in the area but the mass still concerned him and we decided to go ahead with the procedure in order to avoid coming back at a later date  After he was brought to the operating room for general anesthesia his right leg was prepped and draped sterilely Timeout was completed  I made a longitudinal incision over the area in question.  I divided subcutaneous tissue and there was an area of fat collected in this area.   However I was more concerned about the dilated saphenous vein.  In my opinion the dilated saphenous vein was pushing the subcutaneous fat forward making this appear like a mass.  I did not disturb the neuro structures in the area and avoided further dissection and compromise of the saphenous vein irrigated and closed with 2 layers of 0 Monocryl the last and the subcuticular layer with benzoin and Steri-Strips  Sterile dressing applied  Patient can ambulate weight-bear as tolerated

## 2022-03-26 NOTE — Transfer of Care (Signed)
Immediate Anesthesia Transfer of Care Note  Patient: [name]  Procedure(s) Performed: EXCISION MASS RIGHT LOWER EXTREMITY (Right: Ankle)  Patient Location: PACU  Anesthesia Type:General  Level of Consciousness: awake, alert  and oriented  Airway & Oxygen Therapy: Patient Spontanous Breathing and Patient connected to nasal cannula oxygen  Post-op Assessment: Report given to RN and Post -op Vital signs reviewed and stable  Post vital signs: Reviewed and stable  Last Vitals:  Vitals Value Taken Time  BP 135/79 03/26/22 0832  Temp    Pulse 74 03/26/22 0834  Resp 14 03/26/22 0834  SpO2 93 % 03/26/22 0834  Vitals shown include unvalidated device data.  Last Pain:  Vitals:   03/26/22 0636  TempSrc: Oral  PainSc: 0-No pain      Patients Stated Pain Goal: 5 (82/64/15 8309)  Complications: No notable events documented.

## 2022-03-29 ENCOUNTER — Encounter: Payer: Self-pay | Admitting: Orthopedic Surgery

## 2022-03-29 ENCOUNTER — Ambulatory Visit (INDEPENDENT_AMBULATORY_CARE_PROVIDER_SITE_OTHER): Payer: Medicare Other | Admitting: Orthopedic Surgery

## 2022-03-29 DIAGNOSIS — R2241 Localized swelling, mass and lump, right lower limb: Secondary | ICD-10-CM

## 2022-03-29 LAB — SURGICAL PATHOLOGY

## 2022-03-29 NOTE — Progress Notes (Signed)
Chief Complaint  Patient presents with   Post-op Follow-up    Foot /left 03/26/22 mass removal    [name] is postop day 3 he already took a shower.  He wrapped his leg in Saran wrap which is fine  I checked his wound looks good  His path reports not back yet  Expect this is going to be subcutaneous fat with engorged distal saphenous vein causing the tissue to push out and look like a mass when it really was not  I will see him in a couple of weeks around postop day 14 for final wound check he can play golf

## 2022-03-29 NOTE — Patient Instructions (Signed)
Friday remove tapes  Cover with square band aids

## 2022-04-06 ENCOUNTER — Encounter: Payer: Medicare Other | Admitting: Orthopedic Surgery

## 2022-04-07 ENCOUNTER — Encounter: Payer: Self-pay | Admitting: Orthopedic Surgery

## 2022-04-07 ENCOUNTER — Ambulatory Visit (INDEPENDENT_AMBULATORY_CARE_PROVIDER_SITE_OTHER): Payer: Medicare Other | Admitting: Orthopedic Surgery

## 2022-04-07 DIAGNOSIS — D3613 Benign neoplasm of peripheral nerves and autonomic nervous system of lower limb, including hip: Secondary | ICD-10-CM

## 2022-04-07 DIAGNOSIS — T8149XA Infection following a procedure, other surgical site, initial encounter: Secondary | ICD-10-CM | POA: Insufficient documentation

## 2022-04-07 MED ORDER — SULFAMETHOXAZOLE-TRIMETHOPRIM 800-160 MG PO TABS: 1.0000 | ORAL_TABLET | Freq: Two times a day (BID) | ORAL | 1 refills | Status: DC

## 2022-04-07 NOTE — Progress Notes (Signed)
Chief Complaint  Patient presents with   Post-op Follow-up    03/26/22 excision mass right foot/ ankle feels good but incision has some redness no complaints of warmth or pain     [name] is back for wound check.  He has some erythema around the wound  No soreness to palpation  Recommend Bactrim  Follow-up in 2 weeks  Path report came back neuroma   Meds ordered this encounter  Medications   sulfamethoxazole-trimethoprim (BACTRIM DS) 800-160 MG tablet    Sig: Take 1 tablet by mouth 2 (two) times daily.    Dispense:  28 tablet    Refill:  1

## 2022-04-21 ENCOUNTER — Ambulatory Visit (INDEPENDENT_AMBULATORY_CARE_PROVIDER_SITE_OTHER): Payer: Medicare Other | Admitting: Orthopedic Surgery

## 2022-04-21 ENCOUNTER — Encounter: Payer: Self-pay | Admitting: Orthopedic Surgery

## 2022-04-21 DIAGNOSIS — T8149XA Infection following a procedure, other surgical site, initial encounter: Secondary | ICD-10-CM

## 2022-04-21 DIAGNOSIS — D3613 Benign neoplasm of peripheral nerves and autonomic nervous system of lower limb, including hip: Secondary | ICD-10-CM

## 2022-04-21 NOTE — Progress Notes (Signed)
FOLLOW UP   Encounter Diagnoses  Name Primary?   Cellulitis, wound, post-operative Yes   Neurofibroma of foot s/p excision 03/26/22      Chief Complaint  Patient presents with   Post-op Follow-up    Right ankle mass excision 03/26/22 improving    Still having erythema and wound breakdown    Continue the bactrim  Ad neosporin and keep covered  Fu 2 wks

## 2022-04-21 NOTE — Patient Instructions (Addendum)
Please refill his medication Bactrim keep the wound covered use Neosporin on the wound

## 2022-05-06 ENCOUNTER — Encounter: Payer: Self-pay | Admitting: Orthopedic Surgery

## 2022-05-06 ENCOUNTER — Ambulatory Visit (INDEPENDENT_AMBULATORY_CARE_PROVIDER_SITE_OTHER): Payer: Medicare Other | Admitting: Orthopedic Surgery

## 2022-05-06 DIAGNOSIS — T8149XA Infection following a procedure, other surgical site, initial encounter: Secondary | ICD-10-CM

## 2022-05-06 DIAGNOSIS — D3613 Benign neoplasm of peripheral nerves and autonomic nervous system of lower limb, including hip: Secondary | ICD-10-CM

## 2022-05-06 MED ORDER — SULFAMETHOXAZOLE-TRIMETHOPRIM 800-160 MG PO TABS: 1.0000 | ORAL_TABLET | Freq: Two times a day (BID) | ORAL | 1 refills | Status: DC

## 2022-05-06 NOTE — Patient Instructions (Signed)
Apply Neosporin  Wash with soap and water  Cover with Band-Aid  Continue antibiotics

## 2022-05-06 NOTE — Progress Notes (Signed)
Chief Complaint  Patient presents with   Wound Check    03/26/22 right ankle neuroma excision    POD # 41 check wound right ankle seems to have a postop resistant cellulitis at the wound on Bactrim  I placed a picture in the chart in the media section.  The wound is closing.  Continue Neosporin  Bactrim  Wash with soap and water  Follow-up in 2 weeks

## 2022-05-21 ENCOUNTER — Encounter: Payer: Self-pay | Admitting: Orthopedic Surgery

## 2022-05-21 ENCOUNTER — Ambulatory Visit (INDEPENDENT_AMBULATORY_CARE_PROVIDER_SITE_OTHER): Payer: Medicare Other | Admitting: Orthopedic Surgery

## 2022-05-21 DIAGNOSIS — D3613 Benign neoplasm of peripheral nerves and autonomic nervous system of lower limb, including hip: Secondary | ICD-10-CM

## 2022-05-21 DIAGNOSIS — T8149XA Infection following a procedure, other surgical site, initial encounter: Secondary | ICD-10-CM

## 2022-05-21 NOTE — Progress Notes (Signed)
Chief Complaint  Patient presents with   Post-op Follow-up    03/26/22 right foot neurofibroma excision   F/u to check wound   Currently on Bactrim and neosporin  [name]'s wound is improving.  He does not like to wear anything on it.  I asked him to do the Neosporin daily and cover it when he can.  Follow-up in 2 weeks continue Bactrim

## 2022-06-07 ENCOUNTER — Ambulatory Visit (INDEPENDENT_AMBULATORY_CARE_PROVIDER_SITE_OTHER): Payer: Medicare Other | Admitting: Orthopedic Surgery

## 2022-06-07 ENCOUNTER — Encounter: Payer: Self-pay | Admitting: Orthopedic Surgery

## 2022-06-07 DIAGNOSIS — T8149XA Infection following a procedure, other surgical site, initial encounter: Secondary | ICD-10-CM

## 2022-06-07 MED ORDER — SULFAMETHOXAZOLE-TRIMETHOPRIM 800-160 MG PO TABS: 1.0000 | ORAL_TABLET | Freq: Two times a day (BID) | ORAL | 1 refills | Status: DC

## 2022-06-07 NOTE — Progress Notes (Signed)
Chief Complaint  Patient presents with   Ankle Problem    Right, if he needs to continue antibiotics needs refill   [name] comes in for wound check.  The wound is closing he likes to keep it open without coverage because he wears flip-flops a lot  In any event he still has some surrounding erythema has not quite closed up all the way so he will continue with Bactrim  Meds ordered this encounter  Medications   sulfamethoxazole-trimethoprim (BACTRIM DS) 800-160 MG tablet    Sig: Take 1 tablet by mouth 2 (two) times daily.    Dispense:  28 tablet    Refill:  1   Return 1 month

## 2022-07-13 NOTE — Progress Notes (Unsigned)
This is a follow-up visit  The patient had a neuroma removed from his right ankle and developed a postop wound infection he is currently on Bactrim 2 times a day 800-160 mg  He is here for a follow-up visit to see if his wound is healed

## 2022-07-14 ENCOUNTER — Ambulatory Visit (INDEPENDENT_AMBULATORY_CARE_PROVIDER_SITE_OTHER): Payer: Medicare Other | Admitting: Orthopedic Surgery

## 2022-07-14 ENCOUNTER — Encounter: Payer: Self-pay | Admitting: Orthopedic Surgery

## 2022-07-14 DIAGNOSIS — D3613 Benign neoplasm of peripheral nerves and autonomic nervous system of lower limb, including hip: Secondary | ICD-10-CM

## 2022-07-14 DIAGNOSIS — T8149XA Infection following a procedure, other surgical site, initial encounter: Secondary | ICD-10-CM | POA: Diagnosis not present

## 2022-11-11 ENCOUNTER — Encounter: Payer: Self-pay | Admitting: Radiology

## 2023-01-11 ENCOUNTER — Other Ambulatory Visit (HOSPITAL_COMMUNITY): Payer: Self-pay | Admitting: Family Medicine

## 2023-01-11 DIAGNOSIS — Z0001 Encounter for general adult medical examination with abnormal findings: Secondary | ICD-10-CM

## 2023-03-01 ENCOUNTER — Ambulatory Visit (HOSPITAL_COMMUNITY)
Admission: RE | Admit: 2023-03-01 | Discharge: 2023-03-01 | Disposition: A | Discharge status: Home / Self Care | Payer: Medicare Other | Source: Ambulatory Visit | Attending: Family Medicine | Admitting: Family Medicine

## 2023-03-01 DIAGNOSIS — Z122 Encounter for screening for malignant neoplasm of respiratory organs: Secondary | ICD-10-CM | POA: Insufficient documentation

## 2023-03-01 DIAGNOSIS — Z87891 Personal history of nicotine dependence: Secondary | ICD-10-CM | POA: Insufficient documentation

## 2023-03-01 DIAGNOSIS — Z0001 Encounter for general adult medical examination with abnormal findings: Secondary | ICD-10-CM | POA: Diagnosis present

## 2023-05-31 ENCOUNTER — Ambulatory Visit: Payer: Medicare Other | Attending: Cardiovascular Disease | Admitting: Cardiovascular Disease

## 2023-05-31 ENCOUNTER — Encounter: Payer: Self-pay | Admitting: Cardiovascular Disease

## 2023-05-31 VITALS — BP 134/82 | HR 69 | Ht 71.0 in | Wt 258.0 lb

## 2023-05-31 DIAGNOSIS — E785 Hyperlipidemia, unspecified: Secondary | ICD-10-CM

## 2023-05-31 DIAGNOSIS — I10 Essential (primary) hypertension: Secondary | ICD-10-CM

## 2023-05-31 DIAGNOSIS — I2510 Atherosclerotic heart disease of native coronary artery without angina pectoris: Secondary | ICD-10-CM | POA: Insufficient documentation

## 2023-05-31 DIAGNOSIS — R931 Abnormal findings on diagnostic imaging of heart and coronary circulation: Secondary | ICD-10-CM | POA: Insufficient documentation

## 2023-05-31 NOTE — Progress Notes (Signed)
[facility]     05/31/2023 [name]   1955/11/10  [id number]  Primary Physician Assunta Found, MD Primary Cardiologist: Runell Gess MD Nicholes Calamity, [place]  HPI:  [name] is a 67 y.o. mildly overweight married Caucasian male father of 3 children, grandfather of 1 grandchild who is currently retired.  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  He was referred by Dr. Assunta Found in [place] to be established because of risk factors.  He has seen Dr. Rennis Golden remotely back in 2014 with similar problems of obesity, hypertension and dyslipidemia.  He did have a coronary calcium score at that time which was 0.  He has smoked off and on over the years but stopped back in June when he had a chest CT that showed coronary calcification.  He drinks beer on a daily basis.  He does have treated hypertension and hyperlipidemia.  There is no family history of heart disease.  He has never had a heart attack or stroke.  He denies chest pain or shortness of breath.  He does play golf and works out on the treadmill without symptoms.   Current Meds  Medication Sig   atorvastatin (LIPITOR) 10 MG tablet Take 10 mg by mouth at bedtime.   loratadine (CLARITIN) 10 MG tablet Take 10 mg by mouth daily.   Multiple Vitamins-Minerals (PRESERVISION AREDS 2) CAPS Take 1 capsule by mouth daily.   olmesartan (BENICAR) 40 MG tablet Take 20 mg by mouth daily.   Omega-3 1000 MG CAPS Take 1,000 mg by mouth daily.     No Known Allergies  Social History   Socioeconomic History   Marital status: Married    Spouse name: Not on file   Number of children: 2   Years of education: B.S.   Highest education level: Not on file  Occupational History   Occupation: Agricultural engineer: [employer]  Tobacco Use   Smoking status: Some Days    Current packs/day: 0.25    Average packs/day: 0.3 packs/day for 30.0 years (7.5 ttl pk-yrs)     Types: Cigarettes   Smokeless tobacco: Never  Substance and Sexual Activity   Alcohol use: Yes    Alcohol/week: 24.0 standard drinks of alcohol    Types: 12 Cans of beer, 12 Unspecified drink type per week   Drug use: No   Sexual activity: Yes  Other Topics Concern   Not on file  Social History Narrative   Not on file   Social Determinants of Health   Financial Resource Strain: Not on file  Food Insecurity: Not on file  Transportation Needs: Not on file  Physical Activity: Not on file  Stress: Not on file  Social Connections: Not on file  Intimate Partner Violence: Not on file     Review of Systems: General: negative for chills, fever, night sweats or weight changes.  Cardiovascular: negative for chest pain, dyspnea on exertion, edema, orthopnea, palpitations, paroxysmal nocturnal dyspnea or shortness of breath Dermatological: negative for rash Respiratory: negative for cough or wheezing Urologic: negative for hematuria Abdominal: negative for nausea, vomiting, diarrhea, bright red blood per rectum, melena, or hematemesis Neurologic: negative for visual changes, syncope, or dizziness All other systems reviewed and are otherwise negative except as noted above.    Blood pressure 134/82, pulse 69, height 5\' 11"  (1.803 m), weight 258 lb (117 kg).  General appearance: alert  and no distress Neck: no adenopathy, no carotid bruit, no JVD, supple, symmetrical, trachea midline, and thyroid not enlarged, symmetric, no tenderness/mass/nodules Lungs: clear to auscultation bilaterally Heart: regular rate and rhythm, S1, S2 normal, no murmur, click, rub or gallop Extremities: extremities normal, atraumatic, no cyanosis or edema Pulses: 2+ and symmetric Skin: Skin color, texture, turgor normal. No rashes or lesions Neurologic: Grossly normal  EKG EKG Interpretation Date/Time:  Tuesday May 31 2023 09:46:44 EDT Ventricular Rate:  69 PR Interval:  140 QRS Duration:  126 QT  Interval:  412 QTC Calculation: 441 R Axis:   54  Text Interpretation: Normal sinus rhythm Right bundle branch block When compared with ECG of 24-Mar-2022 08:34, No significant change was found Confirmed by Nanetta Batty [phone]) on 05/31/2023 9:55:50 AM    ASSESSMENT AND PLAN:   Dyslipidemia History of hyperlipidemia on low-dose statin therapy with lipid profile performed 01/12/2023 revealed total cholesterol 143, LDL 79 and HDL 48.  I am going to get a repeat coronary calcium score to her stratify.  HTN (hypertension) History of essential potential blood pressure measured today 134/82.  He is on Benicar.  Coronary artery calcification seen on CT scan Chest CT performed 03/01/2023 did reveal calcification in the LAD and right coronary arteries.  He also had calcification on the aortic valve.     Runell Gess MD FACP,FACC,FAHA, [facility] 05/31/2023 10:12 AM

## 2023-05-31 NOTE — Assessment & Plan Note (Signed)
History of hyperlipidemia on low-dose statin therapy with lipid profile performed 01/12/2023 revealed total cholesterol 143, LDL 79 and HDL 48.  I am going to get a repeat coronary calcium score to her stratify.

## 2023-05-31 NOTE — Patient Instructions (Signed)
Medication Instructions:  Your physician recommends that you continue on your current medications as directed. Please refer to the Current Medication list given to you today.  *If you need a refill on your cardiac medications before your next appointment, please call your pharmacy*   Testing/Procedures: Your physician has requested that you have an echocardiogram. Echocardiography is a painless test that uses sound waves to create images of your heart. It provides your doctor with information about the size and shape of your heart and how well your heart's chambers and valves are working. This procedure takes approximately one hour. There are no restrictions for this procedure. Please do NOT wear cologne, perfume, aftershave, or lotions (deodorant is allowed). Please arrive 15 minutes prior to your appointment time. This will take place at [address]   Dr. Allyson Sabal has ordered a CT coronary calcium score.   Test locations:  [facility]  [facility]  This is $99 out of pocket.   Coronary CalciumScan A coronary calcium scan is an imaging test used to look for deposits of calcium and other fatty materials (plaques) in the inner lining of the blood vessels of the heart (coronary arteries). These deposits of calcium and plaques can partly clog and narrow the coronary arteries without producing any symptoms or warning signs. This puts a person at risk for a heart attack. This test can detect these deposits before symptoms develop. Tell a health care provider about: Any allergies you have. All medicines you are taking, including vitamins, herbs, eye drops, creams, and over-the-counter medicines. Any problems you or family members have had with anesthetic medicines. Any blood disorders you have. Any surgeries you have had. Any medical conditions you have. Whether you are pregnant or may be  pregnant. What are the risks? Generally, this is a safe procedure. However, problems may occur, including: Harm to a pregnant woman and her unborn baby. This test involves the use of radiation. Radiation exposure can be dangerous to a pregnant woman and her unborn baby. If you are pregnant, you generally should not have this procedure done. Slight increase in the risk of cancer. This is because of the radiation involved in the test. What happens before the procedure? No preparation is needed for this procedure. What happens during the procedure? You will undress and remove any jewelry around your neck or chest. You will put on a hospital gown. Sticky electrodes will be placed on your chest. The electrodes will be connected to an electrocardiogram (ECG) machine to record a tracing of the electrical activity of your heart. A CT scanner will take pictures of your heart. During this time, you will be asked to lie still and hold your breath for 2-3 seconds while a picture of your heart is being taken. The procedure may vary among health care providers and hospitals. What happens after the procedure? You can get dressed. You can return to your normal activities. It is up to you to get the results of your test. Ask your health care provider, or the department that is doing the test, when your results will be ready. Summary A coronary calcium scan is an imaging test used to look for deposits of calcium and other fatty materials (plaques) in the inner lining of the blood vessels of the heart (coronary arteries). Generally, this is a safe procedure. Tell your health care provider if you are pregnant or may be pregnant. No  preparation is needed for this procedure. A CT scanner will take pictures of your heart. You can return to your normal activities after the scan is done. This information is not intended to replace advice given to you by your health care provider. Make sure you discuss any questions  you have with your health care provider. Document Released: 02/26/2008 Document Revised: 07/19/2016 Document Reviewed: 07/19/2016 Elsevier Interactive Patient Education  2017 [employer].    Follow-Up: At [facility], you and your health needs are our priority.  As part of our continuing mission to provide you with exceptional heart care, we have created designated Provider Care Teams.  These Care Teams include your primary Cardiologist (physician) and Advanced Practice Providers (APPs -  Physician Assistants and Nurse Practitioners) who all work together to provide you with the care you need, when you need it.  We recommend signing up for the patient portal called "MyChart".  Sign up information is provided on this After Visit Summary.  MyChart is used to connect with patients for Virtual Visits (Telemedicine).  Patients are able to view lab/test results, encounter notes, upcoming appointments, etc.  Non-urgent messages can be sent to your provider as well.   To learn more about what you can do with MyChart, go to [email or web address].    Your next appointment:   12 month(s)  Provider:   Nanetta Batty, MD

## 2023-05-31 NOTE — Assessment & Plan Note (Signed)
Chest CT performed 03/01/2023 did reveal calcification in the LAD and right coronary arteries.  He also had calcification on the aortic valve.

## 2023-05-31 NOTE — Assessment & Plan Note (Signed)
History of essential potential blood pressure measured today 134/82.  He is on Benicar.

## 2023-06-28 ENCOUNTER — Ambulatory Visit (HOSPITAL_BASED_OUTPATIENT_CLINIC_OR_DEPARTMENT_OTHER)
Admission: RE | Admit: 2023-06-28 | Discharge: 2023-06-28 | Disposition: A | Discharge status: Home / Self Care | Payer: Medicare Other | Source: Ambulatory Visit | Attending: Cardiovascular Disease | Admitting: Cardiovascular Disease

## 2023-06-28 ENCOUNTER — Ambulatory Visit (HOSPITAL_BASED_OUTPATIENT_CLINIC_OR_DEPARTMENT_OTHER): Payer: Medicare Other

## 2023-06-28 DIAGNOSIS — I10 Essential (primary) hypertension: Secondary | ICD-10-CM | POA: Insufficient documentation

## 2023-06-28 DIAGNOSIS — E785 Hyperlipidemia, unspecified: Secondary | ICD-10-CM

## 2023-06-28 DIAGNOSIS — I2510 Atherosclerotic heart disease of native coronary artery without angina pectoris: Secondary | ICD-10-CM | POA: Insufficient documentation

## 2023-06-28 LAB — ECHOCARDIOGRAM COMPLETE
Area-P 1/2: 3.03 cm2
S' Lateral: 3.16 cm

## 2023-06-29 ENCOUNTER — Other Ambulatory Visit (HOSPITAL_BASED_OUTPATIENT_CLINIC_OR_DEPARTMENT_OTHER): Payer: Self-pay

## 2023-06-29 DIAGNOSIS — I2510 Atherosclerotic heart disease of native coronary artery without angina pectoris: Secondary | ICD-10-CM

## 2023-06-29 DIAGNOSIS — E785 Hyperlipidemia, unspecified: Secondary | ICD-10-CM

## 2023-06-29 MED ORDER — ATORVASTATIN CALCIUM 20 MG PO TABS: 20.0000 mg | ORAL_TABLET | Freq: Every day | ORAL | 3 refills | Status: AC

## 2023-10-26 ENCOUNTER — Encounter: Payer: Self-pay | Admitting: Cardiovascular Disease

## 2024-01-17 ENCOUNTER — Encounter (INDEPENDENT_AMBULATORY_CARE_PROVIDER_SITE_OTHER): Payer: Self-pay | Admitting: *Deleted

## 2024-01-27 ENCOUNTER — Telehealth (INDEPENDENT_AMBULATORY_CARE_PROVIDER_SITE_OTHER): Payer: Self-pay | Admitting: Gastroenterology

## 2024-01-27 NOTE — Telephone Encounter (Signed)
Ok to schedule.  Room : any   Thanks,  Marc Lawman, MD Gastroenterology and Hepatology [facility]

## 2024-01-27 NOTE — Telephone Encounter (Signed)
 Who is your primary care physician: Dr.John Glady Laming  Reasons for the colonoscopy:   Have you had a colonoscopy before?  Yes 10 years ago  Do you have family history of colon cancer? no  Previous colonoscopy with polyps removed? Yes not sure  Do you have a history colorectal cancer?   no  Are you diabetic? If yes, Type 1 or Type 2?    no  Do you have a prosthetic or mechanical heart valve? no  Do you have a pacemaker/defibrillator?   no  Have you had endocarditis/atrial fibrillation? no  Have you had joint replacement within the last 12 months?  no  Do you tend to be constipated or have to use laxatives? no  Do you have any history of drugs or alchohol?  no  Do you use supplemental oxygen?  no  Have you had a stroke or heart attack within the last 6 months? no  Do you take weight loss medication?  no      Do you take any blood-thinning medications such as: (aspirin, warfarin, Plavix, Aggrenox)  no  If yes we need the name, milligram, dosage and who is prescribing doctor  Current Outpatient Medications on File Prior to Visit  Medication Sig Dispense Refill   atorvastatin  (LIPITOR) 20 MG tablet Take 1 tablet (20 mg total) by mouth at bedtime. 90 tablet 3   loratadine (CLARITIN) 10 MG tablet Take 10 mg by mouth daily.     Multiple Vitamins-Minerals (PRESERVISION AREDS 2) CAPS Take 1 capsule by mouth daily.     olmesartan (BENICAR) 40 MG tablet Take 20 mg by mouth daily.     Omega-3 1000 MG CAPS Take 1,000 mg by mouth daily. (Patient not taking: Reported on 01/27/2024)     No current facility-administered medications on file prior to visit.    No Known Allergies   Pharmacy: [facility]  Primary Insurance Name: [facility] Medicare  Best number where you can be reached: [phone]

## 2024-01-30 NOTE — Telephone Encounter (Signed)
 Left message to return call

## 2024-01-30 NOTE — Telephone Encounter (Signed)
 Pt left voicemail returning call. Returned call to pt. Pt states last TCS was in August. Pt would like to be scheduled in August or September. I have placed triage in Future Scheduling folder

## 2024-03-22 NOTE — Telephone Encounter (Signed)
 LMOVM to call back to schedule for August

## 2024-04-02 ENCOUNTER — Ambulatory Visit (HOSPITAL_COMMUNITY)
Admission: RE | Admit: 2024-04-02 | Discharge: 2024-04-02 | Disposition: A | Discharge status: Home / Self Care | Source: Ambulatory Visit | Attending: Family Medicine | Admitting: Family Medicine

## 2024-04-02 ENCOUNTER — Other Ambulatory Visit (HOSPITAL_COMMUNITY): Payer: Self-pay | Admitting: Family Medicine

## 2024-04-02 DIAGNOSIS — M7989 Other specified soft tissue disorders: Secondary | ICD-10-CM

## 2024-04-02 DIAGNOSIS — L03115 Cellulitis of right lower limb: Secondary | ICD-10-CM | POA: Insufficient documentation

## 2024-04-05 ENCOUNTER — Emergency Department (HOSPITAL_COMMUNITY)

## 2024-04-05 ENCOUNTER — Other Ambulatory Visit: Payer: Self-pay

## 2024-04-05 ENCOUNTER — Observation Stay (HOSPITAL_COMMUNITY)
Admission: EM | Admit: 2024-04-05 | Discharge: 2024-04-06 | Disposition: A | Discharge status: Home / Self Care | Source: Other Acute Inpatient Hospital | Attending: Internal Medicine | Admitting: Internal Medicine

## 2024-04-05 ENCOUNTER — Encounter (HOSPITAL_COMMUNITY): Payer: Self-pay | Admitting: *Deleted

## 2024-04-05 DIAGNOSIS — E782 Mixed hyperlipidemia: Secondary | ICD-10-CM | POA: Insufficient documentation

## 2024-04-05 DIAGNOSIS — F1090 Alcohol use, unspecified, uncomplicated: Secondary | ICD-10-CM | POA: Insufficient documentation

## 2024-04-05 DIAGNOSIS — Z6834 Body mass index (BMI) 34.0-34.9, adult: Secondary | ICD-10-CM | POA: Insufficient documentation

## 2024-04-05 DIAGNOSIS — E669 Obesity, unspecified: Secondary | ICD-10-CM

## 2024-04-05 DIAGNOSIS — L03115 Cellulitis of right lower limb: Principal | ICD-10-CM | POA: Insufficient documentation

## 2024-04-05 DIAGNOSIS — E66811 Obesity, class 1: Secondary | ICD-10-CM | POA: Insufficient documentation

## 2024-04-05 DIAGNOSIS — L0390 Cellulitis, unspecified: Secondary | ICD-10-CM | POA: Diagnosis present

## 2024-04-05 DIAGNOSIS — I10 Essential (primary) hypertension: Secondary | ICD-10-CM | POA: Diagnosis not present

## 2024-04-05 DIAGNOSIS — F17210 Nicotine dependence, cigarettes, uncomplicated: Secondary | ICD-10-CM | POA: Diagnosis not present

## 2024-04-05 DIAGNOSIS — L03119 Cellulitis of unspecified part of limb: Secondary | ICD-10-CM | POA: Diagnosis present

## 2024-04-05 LAB — CBC WITH DIFFERENTIAL/PLATELET
Abs Immature Granulocytes: 0.12 K/uL — ABNORMAL HIGH (ref 0.00–0.07)
Basophils Absolute: 0.1 K/uL (ref 0.0–0.1)
Basophils Relative: 1 %
Eosinophils Absolute: 0.1 K/uL (ref 0.0–0.5)
Eosinophils Relative: 1 %
HCT: 39 % (ref 39.0–52.0)
Hemoglobin: 13.3 g/dL (ref 13.0–17.0)
Immature Granulocytes: 1 %
Lymphocytes Relative: 13 %
Lymphs Abs: 1.6 K/uL (ref 0.7–4.0)
MCH: 32 pg (ref 26.0–34.0)
MCHC: 34.1 g/dL (ref 30.0–36.0)
MCV: 93.8 fL (ref 80.0–100.0)
Monocytes Absolute: 1.1 K/uL — ABNORMAL HIGH (ref 0.1–1.0)
Monocytes Relative: 9 %
Neutro Abs: 9 K/uL — ABNORMAL HIGH (ref 1.7–7.7)
Neutrophils Relative %: 75 %
Platelets: 228 K/uL (ref 150–400)
RBC: 4.16 MIL/uL — ABNORMAL LOW (ref 4.22–5.81)
RDW: 13.2 % (ref 11.5–15.5)
WBC: 12 K/uL — ABNORMAL HIGH (ref 4.0–10.5)
nRBC: 0 % (ref 0.0–0.2)

## 2024-04-05 LAB — BASIC METABOLIC PANEL WITH GFR
Anion gap: 12 (ref 5–15)
BUN: 13 mg/dL (ref 8–23)
CO2: 24 mmol/L (ref 22–32)
Calcium: 9.3 mg/dL (ref 8.9–10.3)
Chloride: 102 mmol/L (ref 98–111)
Creatinine, Ser: 0.96 mg/dL (ref 0.61–1.24)
GFR, Estimated: >60 mL/min (ref >60)
Glucose, Bld: 129 mg/dL — ABNORMAL HIGH (ref 70–99)
Potassium: 3.6 mmol/L (ref 3.5–5.1)
Sodium: 138 mmol/L (ref 135–145)

## 2024-04-05 LAB — LACTIC ACID, PLASMA
Lactic Acid, Venous: 1.1 mmol/L (ref 0.5–1.9)
Lactic Acid, Venous: 1.1 mmol/L (ref 0.5–1.9)

## 2024-04-05 MED ORDER — ONDANSETRON HCL 4 MG PO TABS
4.0000 mg | ORAL_TABLET | Freq: Four times a day (QID) | ORAL | Status: DC | PRN
Start: 2024-04-05 — End: 2024-04-06

## 2024-04-05 MED ORDER — ACETAMINOPHEN 325 MG PO TABS: 650.0000 mg | ORAL_TABLET | Freq: Four times a day (QID) | ORAL | Status: DC | PRN

## 2024-04-05 MED ORDER — CEFAZOLIN SODIUM-DEXTROSE 2-4 GM/100ML-% IV SOLN
2.0000 g | Freq: Three times a day (TID) | INTRAVENOUS | Status: DC
  Administered 2024-04-05 – 2024-04-06 (×3): 2 g via INTRAVENOUS
  Filled 2024-04-05 (×3): qty 100

## 2024-04-05 MED ORDER — ACETAMINOPHEN 650 MG RE SUPP: 650.0000 mg | Freq: Four times a day (QID) | RECTAL | Status: DC | PRN

## 2024-04-05 MED ORDER — ENOXAPARIN SODIUM 60 MG/0.6ML IJ SOSY
50.0000 mg | PREFILLED_SYRINGE | INTRAMUSCULAR | Status: DC
  Administered 2024-04-05: 50 mg via SUBCUTANEOUS
  Filled 2024-04-05: qty 0.6

## 2024-04-05 MED ORDER — CEFAZOLIN SODIUM-DEXTROSE 2-4 GM/100ML-% IV SOLN: 2.0000 g | Freq: Three times a day (TID) | INTRAVENOUS | Status: DC

## 2024-04-05 MED ORDER — VANCOMYCIN HCL IN DEXTROSE 1-5 GM/200ML-% IV SOLN
1000.0000 mg | Freq: Once | INTRAVENOUS | Status: DC
  Filled 2024-04-05: qty 200

## 2024-04-05 MED ORDER — VANCOMYCIN HCL 2000 MG/400ML IV SOLN
2000.0000 mg | Freq: Once | INTRAVENOUS | Status: AC
  Administered 2024-04-05: 2000 mg via INTRAVENOUS
  Filled 2024-04-05: qty 400

## 2024-04-05 MED ORDER — ONDANSETRON HCL 4 MG/2ML IJ SOLN: 4.0000 mg | Freq: Four times a day (QID) | INTRAMUSCULAR | Status: DC | PRN

## 2024-04-05 NOTE — H&P (Signed)
 History and Physical    Patient: [name] FMW:[id number] DOB: 12/26/1955 DOA: 04/05/2024 DOS: the patient was seen and examined on 04/05/2024 PCP: Marvine Rush, MD  Patient coming from: Home  Chief Complaint:  Chief Complaint  Patient presents with   Cellulitis   HPI: [name] is a 68 year old male with history of hypertension, hyperlipidemia and macular degeneration presenting with 6-day history of redness, swelling, and pain of his right lower extremity.  The patient was outside weeding his yard on 03/30/2024.  On 03/31/2024, he was moving cinderblocks and may have scraped his leg.  On the evening of 03/31/2024, he noticed some redness on the right lateral aspect of his calf.  By the next day, 03/31/2024 he began having a headache and noticed some redness spreading down distally.  He went to see his PCP on 04/02/2024.  The patient was given an injection of Rocephin and prescription for doxycycline.  Venous duplex was ordered and was negative for DVT.  He followed up in the office on the morning of 04/05/2024.  He continued to have some erythema and swelling.  He was given another injection of ceftriaxone and told to go to emergency department for further evaluation and treatment.  The patient has subjective chills.  He denied any other trauma.  He denied any chest pain, shortness breath, cough, hemoptysis, nausea, vomiting, diarrhea. He states his leg pain is about 50% better, but concerned about the redness and swelling.    In the ED, patient is afebrile and hemodynamically stable with oxygen saturation 98% on RA.  WBC 12.0, Hgb 13.3, platelets 228.  Na 138, K 3.6, bicarb 24, serum creatinine 0.96.  Lactic 1.1.  xray right tib/fib neg for osseous abnormality.  He was started on vanco.   Review of Systems: As mentioned in the history of present illness. All other systems reviewed and are negative. Past Medical History:  Diagnosis Date   Dyslipidemia    Family history of heart  disease    father died @ 12 of HF   Hypertension    OA (osteoarthritis)    Obesity    Past Surgical History:  Procedure Laterality Date   Bilateral knee replacements  2009   COLONOSCOPY     COLONOSCOPY N/A 04/17/2014   Procedure: COLONOSCOPY;  Surgeon: Claudis RAYMOND Rivet, MD;  Location: [facility];  Service: Endoscopy;  Laterality: N/A;  730-moved to 1030  Ann to notify   Coronary Calcium  Score  12/2011   zero coronary calcium    EXCISION MASS LOWER EXTREMETIES Right 03/26/2022   Procedure: EXCISION MASS RIGHT LOWER EXTREMITY;  Surgeon: Margrette Taft BRAVO, MD;  Location: [facility];  Service: Orthopedics;  Laterality: Right;   KNEE SURGERY Right 2009, 2010   partial right knee replacement   VASECTOMY  1990   Social History:  reports that he has been smoking cigarettes. He has a 7.5 pack-year smoking history. He has never used smokeless tobacco. He reports current alcohol use of about 24.0 standard drinks of alcohol per week. He reports that he does not use drugs.  No Known Allergies  Family History  Problem Relation Age of Onset   Heart failure Father    Diabetes Father    Atrial fibrillation Mother    Colon cancer Neg Hx     Prior to Admission medications   Medication Sig Start Date End Date Taking? Authorizing Provider  atorvastatin  (LIPITOR) 20 MG tablet Take 1 tablet (20 mg total) by mouth at bedtime. 06/29/23  Court Dorn PARAS, MD  doxycycline (VIBRA-TABS) 100 MG tablet Take 100 mg by mouth 2 (two) times daily. 04/02/24   [provider]  loratadine (CLARITIN) 10 MG tablet Take 10 mg by mouth daily.    [provider]  Multiple Vitamins-Minerals (PRESERVISION AREDS 2) CAPS Take 1 capsule by mouth daily.    [provider]  mupirocin ointment (BACTROBAN) 2 % Apply 1 Application topically 3 (three) times daily. 04/02/24   [provider]  olmesartan (BENICAR) 40 MG tablet Take 20 mg by mouth daily.    [provider]  Omega-3 1000 MG  CAPS Take 1,000 mg by mouth daily. Patient not taking: Reported on 01/27/2024    [provider]    Physical Exam: Vitals:   04/05/24 1132 04/05/24 1132 04/05/24 1135  BP:  (!) 145/77   Pulse:  82 76  Resp:  16   Temp:  98.3 F (36.8 C)   TempSrc:  Oral   SpO2:   98%  Weight: 111.6 kg    Height: 5' 11 (1.803 m)     GENERAL:  A&O x 3, NAD, well developed, cooperative, follows commands HEENT: [place]/AT, No thrush, No icterus, No oral ulcers Neck:  No neck mass, No meningismus, soft, supple CV: RRR, no S3, no S4, no rub, no JVD Lungs:  CTA, no wheeze, no rhonchi, good air movement Abd: soft/NT +BS, nondistended Ext: 1 + RLE edema with erythema from ankle to infrapatellar area. Neuro:  CN II-XII intact, strength 4/5 in RUE, RLE, strength 4/5 LUE, LLE; sensation intact bilateral; no dysmetria; babinski equivocal      Data Reviewed: Data reviewed above in history  Assessment and Plan:  Cellulitis right leg -failed oral antibiotics -start cefazolin   Essential hypertension -continue olmesartan  Mixed Hyperlipidemia -continue statin   Advance Care Planning: FULL CODE  Consults: none  Family Communication: none present  Severity of Illness: The appropriate patient status for this patient is OBSERVATION. Observation status is judged to be reasonable and necessary in order to provide the required intensity of service to ensure the patient's safety. The patient's presenting symptoms, physical exam findings, and initial radiographic and laboratory data in the context of their medical condition is felt to place them at decreased risk for further clinical deterioration. Furthermore, it is anticipated that the patient will be medically stable for discharge from the hospital within 2 midnights of admission.   Author: Alm Schneider, MD 04/05/2024 3:23 PM  For on call review [email or web address].

## 2024-04-05 NOTE — ED Provider Notes (Signed)
 [name]   CSN: [id number] Arrival date & time: 04/05/24  1119     Patient presents with: Cellulitis   [name] is a 68 y.o. male.   Patient is a 68 year old male who presents to the emergency department with a chief complaint of redness and swelling to his right lower extremity.  He notes that symptoms have been ongoing for approximate the past 5 days.  He was seen by his primary care doctor 3 days ago and placed on doxycycline .  He notes that symptoms have continued to worsen since that time.  He was evaluated yesterday during which time a venous duplex was performed which was negative for DVT.  Patient notes he is unsure if he may have been bitten by something.  He notes that he has had some generalized malaise and fatigue as well as intermittent headaches.  He denies any associated dizziness, lightheadedness, syncope.        Prior to Admission medications   Medication Sig Start Date End Date Taking? Authorizing Provider  atorvastatin  (LIPITOR) 20 MG tablet Take 1 tablet (20 mg total) by mouth at bedtime. 06/29/23   [name]  loratadine (CLARITIN) 10 MG tablet Take 10 mg by mouth daily.    Provider, Historical, [name]  Multiple Vitamins-Minerals (PRESERVISION AREDS 2) CAPS Take 1 capsule by mouth daily.    Provider, Historical, [name]  olmesartan (BENICAR) 40 MG tablet Take 20 mg by mouth daily.    Provider, Historical, [name]  Omega-3 1000 MG CAPS Take 1,000 mg by mouth daily. Patient not taking: Reported on 01/27/2024    Provider, Historical, [name]    Allergies: Patient has no known allergies.    Review of Systems  Skin:        Redness and swelling to right lower extremity  All other systems reviewed and are negative.   Updated Vital Signs BP (!) 145/77 (BP Location: Right Arm)   Pulse 76   Temp 98.3 F (36.8 C) (Oral)   Resp 16   Ht 5' 11 (1.803 m)   Wt 111.6 kg   SpO2 98%   BMI 34.31 kg/m    Physical Exam Vitals and nursing [name] reviewed.  Constitutional:      Appearance: Normal appearance.  HENT:     Head: Normocephalic and atraumatic.     Nose: Nose normal.     Mouth/Throat:     Mouth: Mucous membranes are moist.  Eyes:     Extraocular Movements: Extraocular movements intact.     Conjunctiva/sclera: Conjunctivae normal.     Pupils: Pupils are equal, round, and reactive to light.  Cardiovascular:     Rate and Rhythm: Normal rate and regular rhythm.     Pulses: Normal pulses.     Heart sounds: Normal heart sounds. No murmur heard.    No gallop.  Pulmonary:     Effort: Pulmonary effort is normal. No respiratory distress.     Breath sounds: Normal breath sounds. No stridor. No wheezing, rhonchi or rales.  Abdominal:     General: Abdomen is flat. Bowel sounds are normal. There is no distension.     Palpations: Abdomen is soft.     Tenderness: There is no abdominal tenderness. There is no guarding.  Musculoskeletal:        General: No tenderness, deformity or signs of injury. Normal range of motion.     Cervical back: Normal range of motion and neck supple.  Right lower leg: Edema present.  Skin:    General: Skin is warm and dry.     Comments: Cellulitic changes noted over the right lower extremity, no areas of induration or fluctuance, no abscess formation  Neurological:     General: No focal deficit present.     Mental Status: He is alert and oriented to person, place, and time. Mental status is at baseline.  Psychiatric:        Mood and Affect: Mood normal.        Behavior: Behavior normal.        Thought Content: Thought content normal.        Judgment: Judgment normal.     (all labs ordered are listed, but only abnormal results are displayed) Labs Reviewed  CBC WITH DIFFERENTIAL/PLATELET - Abnormal; Notable for the following components:      Result Value   WBC 12.0 (*)    RBC 4.16 (*)    Neutro Abs 9.0 (*)    Monocytes Absolute 1.1 (*)    Abs  Immature Granulocytes 0.12 (*)    All other components within normal limits  BASIC METABOLIC PANEL WITH GFR - Abnormal; Notable for the following components:   Glucose, Bld 129 (*)    All other components within normal limits  CULTURE, BLOOD (ROUTINE X 2)  CULTURE, BLOOD (ROUTINE X 2)  LACTIC ACID, PLASMA  LACTIC ACID, PLASMA    EKG: None  Radiology: No results found.   Procedures   Medications Ordered in the ED  vancomycin  (VANCOREADY) IVPB 2000 mg/400 mL (has no administration in time range)                                    Medical Decision Making Amount and/or Complexity of Data Reviewed Labs: ordered. Radiology: ordered.  Risk Prescription drug management. Decision regarding hospitalization.   This patient presents to the ED for concern of cellulitis to right lower extremity, this involves an extensive number of treatment options, and is a complaint that carries with it a high risk of complications and morbidity.  The differential diagnosis includes cellulitis, abscess summation, necrotizing fasciitis, PAD, DVT   Co morbidities that complicate the patient evaluation  None   Additional history obtained:  Additional history obtained from medical records External records from outside source obtained and reviewed including medical records   Lab Tests:  I Ordered, and personally interpreted labs.  The pertinent results include: Mild leukocytosis, no anemia, normal kidney function, normal electrolytes, negative lactic acid   Imaging Studies ordered:  I ordered imaging studies including x-ray of right tib-fib I independently visualized and interpreted imaging which showed no acute osseous injury or lesions I agree with the radiologist interpretation    Consultations Obtained:  I requested consultation with the hospitalist,  and discussed lab and imaging findings as well as pertinent plan - they recommend: Admission   Problem List / ED Course /  Critical interventions / Medication management  Patient is doing well at this time.  Discussed with patient we will plan for admission to the hospital service given his cellulitis which is currently failing outpatient management.  Was given a dose of vancomycin  in the emergency department.  Blood work is overall been unremarkable except for a mild leukocytosis.  Vital signs are stable and he has no indication for sepsis.  X-ray was unremarkable as well.  Low suspicion for osteomyelitis.  He has  no clinical indication for necrotizing fasciitis.  Did discuss patient case with Dr. Evonnie with the hospitalist who has excepted for admission. I ordered medication including vancomycin  for cellulitis Reevaluation of the patient after these medicines showed that the patient improved I have reviewed the patients home medicines and have made adjustments as needed   Social Determinants of Health:  None   Test / Admission - Considered:  Admission     Final diagnoses:  Cellulitis of left lower extremity    ED Discharge Orders     None          [name] 04/05/24 1436    Suzette Pac, [name] 04/08/24 [phone]

## 2024-04-05 NOTE — Hospital Course (Addendum)
 68 year old male with history of hypertension, hyperlipidemia and macular degeneration presenting with 6-day history of redness, swelling, and pain of his right lower extremity.  The patient was outside weeding his yard on 03/30/2024.  On 03/31/2024, he was moving cinderblocks and may have scraped his leg.  On the evening of 03/31/2024, he noticed some redness on the right lateral aspect of his calf.  By the next day, 03/31/2024 he began having a headache and noticed some redness spreading down distally.  He went to see his PCP on 04/02/2024.  The patient was given an injection of Rocephin and prescription for doxycycline.  Venous duplex was ordered and was negative for DVT.  He followed up in the office on the morning of 04/05/2024.  He continued to have some erythema and swelling.  He was given another injection of ceftriaxone and told to go to emergency department for further evaluation and treatment.  The patient has subjective chills.  He denied any other trauma.  He denied any chest pain, shortness breath, cough, hemoptysis, nausea, vomiting, diarrhea. He states his leg pain is about 50% better, but concerned about the redness and swelling.    In the ED, patient is afebrile and hemodynamically stable with oxygen saturation 98% on RA.  WBC 12.0, Hgb 13.3, platelets 228.  Na 138, K 3.6, bicarb 24, serum creatinine 0.96.  Lactic 1.1.  xray right tib/fib neg for osseous abnormality.  He was started on vanco.

## 2024-04-05 NOTE — ED Triage Notes (Signed)
 Pt with redness and swelling to right lower leg.  HA on Saturday unknown of fevers.  Seen PCP Monday and sent here for US  to r/o DVT and was negative. Blisters noted yesterday.

## 2024-04-06 DIAGNOSIS — E669 Obesity, unspecified: Secondary | ICD-10-CM | POA: Diagnosis not present

## 2024-04-06 DIAGNOSIS — L03115 Cellulitis of right lower limb: Secondary | ICD-10-CM | POA: Diagnosis not present

## 2024-04-06 DIAGNOSIS — I10 Essential (primary) hypertension: Secondary | ICD-10-CM | POA: Diagnosis not present

## 2024-04-06 LAB — BASIC METABOLIC PANEL WITH GFR
Anion gap: 8 (ref 5–15)
BUN: 11 mg/dL (ref 8–23)
CO2: 24 mmol/L (ref 22–32)
Calcium: 8.4 mg/dL — ABNORMAL LOW (ref 8.9–10.3)
Chloride: 105 mmol/L (ref 98–111)
Creatinine, Ser: 0.84 mg/dL (ref 0.61–1.24)
GFR, Estimated: >60 mL/min (ref >60)
Glucose, Bld: 144 mg/dL — ABNORMAL HIGH (ref 70–99)
Potassium: 3.8 mmol/L (ref 3.5–5.1)
Sodium: 137 mmol/L (ref 135–145)

## 2024-04-06 LAB — HIV ANTIBODY (ROUTINE TESTING W REFLEX): HIV Screen 4th Generation wRfx: NONREACTIVE

## 2024-04-06 LAB — CBC
HCT: 36.2 % — ABNORMAL LOW (ref 39.0–52.0)
Hemoglobin: 12.3 g/dL — ABNORMAL LOW (ref 13.0–17.0)
MCH: 31.9 pg (ref 26.0–34.0)
MCHC: 34 g/dL (ref 30.0–36.0)
MCV: 94 fL (ref 80.0–100.0)
Platelets: 212 K/uL (ref 150–400)
RBC: 3.85 MIL/uL — ABNORMAL LOW (ref 4.22–5.81)
RDW: 13.2 % (ref 11.5–15.5)
WBC: 10.5 K/uL (ref 4.0–10.5)
nRBC: 0 % (ref 0.0–0.2)

## 2024-04-06 MED ORDER — DOXYCYCLINE HYCLATE 100 MG PO TABS
100.0000 mg | ORAL_TABLET | Freq: Two times a day (BID) | ORAL | Status: DC
  Administered 2024-04-06: 100 mg via ORAL
  Filled 2024-04-06: qty 1

## 2024-04-06 MED ORDER — CEFADROXIL 500 MG PO CAPS: 1000.0000 mg | ORAL_CAPSULE | Freq: Two times a day (BID) | ORAL | 0 refills | Status: DC

## 2024-04-06 MED ORDER — DOXYCYCLINE HYCLATE 100 MG PO TABS: 100.0000 mg | ORAL_TABLET | Freq: Two times a day (BID) | ORAL | 0 refills | Status: DC

## 2024-04-06 MED ORDER — CEFADROXIL 500 MG PO CAPS
1000.0000 mg | ORAL_CAPSULE | Freq: Two times a day (BID) | ORAL | Status: DC
  Administered 2024-04-06: 1000 mg via ORAL
  Filled 2024-04-06 (×3): qty 2

## 2024-04-06 NOTE — Progress Notes (Signed)
 Mobility Specialist Progress Note:    04/06/24 0932  Mobility  Activity Ambulated with assistance in hallway  Level of Assistance Independent  Assistive Device None  Distance Ambulated (ft) 250 ft  Range of Motion/Exercises Active;All extremities  Activity Response Tolerated well  Mobility Referral Yes  Mobility visit 1 Mobility  Mobility Specialist Start Time (ACUTE ONLY) 0932  Mobility Specialist Stop Time (ACUTE ONLY) 0951  Mobility Specialist Time Calculation (min) (ACUTE ONLY) 19 min   Pt received sitting EOB, nurse in room. Agreeable to mobility, Independently able to stand and ambulate with no AD. Tolerated well,asx throughout. Returned pt sitting EOB, all needs met.  Nzinga Ferran Mobility Specialist Please contact via Special educational needs teacher or  Rehab office at [phone]

## 2024-04-06 NOTE — Progress Notes (Signed)
   04/06/24 0939  TOC Brief Assessment  Insurance and Status Reviewed  Patient has primary care physician Yes  Home environment has been reviewed Home w/ spouse  Prior level of function: Independent  Prior/Current Home Services No current home services  Social Drivers of Health Review SDOH reviewed no interventions necessary  Readmission risk has been reviewed Yes  Transition of care needs no transition of care needs at this time

## 2024-04-06 NOTE — Discharge Summary (Addendum)
 Physician Discharge Summary   Patient: [name] MRN: [id number] DOB: Aug 09, 1956  Admit date:     04/05/2024  Discharge date: 04/06/24  Discharge Physician: Alm Fryda Molenda   PCP: Marvine Rush, MD   Recommendations at discharge:   Please follow up with primary care provider within 1-2 weeks  Please repeat BMP and CBC in one week    Hospital Course: 68 year old male with history of hypertension, hyperlipidemia and macular degeneration presenting with 6-day history of redness, swelling, and pain of his right lower extremity.  The patient was outside weeding his yard on 03/30/2024.  On 03/31/2024, he was moving cinderblocks and may have scraped his leg.  On the evening of 03/31/2024, he noticed some redness on the right lateral aspect of his calf.  By the next day, 03/31/2024 he began having a headache and noticed some redness spreading down distally.  He went to see his PCP on 04/02/2024.  The patient was given an injection of Rocephin and prescription for doxycycline.  Venous duplex was ordered and was negative for DVT.  He followed up in the office on the morning of 04/05/2024.  He continued to have some erythema and swelling.  He was given another injection of ceftriaxone and told to go to emergency department for further evaluation and treatment.  The patient has subjective chills.  He denied any other trauma.  He denied any chest pain, shortness breath, cough, hemoptysis, nausea, vomiting, diarrhea. He states his leg pain is about 50% better, but concerned about the redness and swelling.    In the ED, patient is afebrile and hemodynamically stable with oxygen saturation 98% on RA.  WBC 12.0, Hgb 13.3, platelets 228.  Na 138, K 3.6, bicarb 24, serum creatinine 0.96.  Lactic 1.1.  xray right tib/fib neg for osseous abnormality.  He given vancomycin  x 1 in ED.  Subsequently, he was started on IV cefazolin .  He improved clinically with decrease erythema, edema and pain and WBC improved.  He will d/c  home with cefadroxil  and doxy x 9 more days.   Assessment and Plan: Cellulitis right leg -failed oral antibiotics -started cefazolin  -WBC improved with improving edema, erythema, pain -d/c home with cefadroxil  and doxy x 9 more days   Essential hypertension -continue olmesartan   Mixed Hyperlipidemia -continue statin  Class 1 Obesity -BMI 34.31 -lifestyle modification  Consultants: none Procedures performed: none  Disposition: Home Diet recommendation:  Cardiac diet DISCHARGE MEDICATION: Allergies as of 04/06/2024   No Known Allergies      Medication List     TAKE these medications    atorvastatin  20 MG tablet Commonly known as: LIPITOR Take 1 tablet (20 mg total) by mouth at bedtime.   AVASTIN IV Place 1 Dose into the right eye every 5 (five) weeks. For wet AMD   cefadroxil 500 MG capsule Commonly known as: DURICEF Take 2 capsules (1,000 mg total) by mouth 2 (two) times daily.   doxycycline 100 MG tablet Commonly known as: VIBRA-TABS Take 1 tablet (100 mg total) by mouth 2 (two) times daily.   loratadine 10 MG tablet Commonly known as: CLARITIN Take 10 mg by mouth daily.   mupirocin ointment 2 % Commonly known as: BACTROBAN Apply 1 Application topically 3 (three) times daily.   olmesartan 40 MG tablet Commonly known as: BENICAR Take 20 mg by mouth daily.   PreserVision AREDS 2 Caps Take 1 capsule by mouth in the morning and at bedtime.  Discharge Exam: Filed Weights   04/05/24 1132 04/05/24 1534  Weight: 111.6 kg 111.6 kg   HEENT:  [place]/AT, No thrush, no icterus CV:  RRR, no rub, no S3, no S4 Lung:  CTA, no wheeze, no rhonchi Abd:  soft/+BS, NT Ext:  see pic below      Condition at discharge: stable  The results of significant diagnostics from this hospitalization (including imaging, microbiology, ancillary and laboratory) are listed below for reference.   Imaging Studies: DG Tibia/Fibula Right Result Date:  04/05/2024 CLINICAL DATA:  Redness, swelling right leg. EXAM: RIGHT TIBIA AND FIBULA - 2 VIEW COMPARISON:  None Available. FINDINGS: Changes of right knee medial compartment hemiarthroplasty. No hardware complicating feature. No visible joint effusion within the right knee. No acute bony abnormality. Specifically, no fracture, subluxation, or dislocation. No radiopaque foreign body or soft tissue gas. IMPRESSION: No acute bony abnormality. Electronically Signed   By: Franky Crease M.D.   On: 04/05/2024 14:20   US  Venous Img Lower Unilateral Right (DVT) Result Date: 04/02/2024 CLINICAL DATA:  Right lower extremity swelling. EXAM: RIGHT LOWER EXTREMITY VENOUS DOPPLER ULTRASOUND TECHNIQUE: Gray-scale sonography with graded compression, as well as color Doppler and duplex ultrasound were performed to evaluate the lower extremity deep venous systems from the level of the common femoral vein and including the common femoral, femoral, profunda femoral, popliteal and calf veins including the posterior tibial, peroneal and gastrocnemius veins when visible. The superficial great saphenous vein was also interrogated. Spectral Doppler was utilized to evaluate flow at rest and with distal augmentation maneuvers in the common femoral, femoral and popliteal veins. COMPARISON:  None Available. FINDINGS: Contralateral Common Femoral Vein: Respiratory phasicity is normal and symmetric with the symptomatic side. No evidence of thrombus. Normal compressibility. Common Femoral Vein: No evidence of thrombus. Normal compressibility, respiratory phasicity and response to augmentation. Saphenofemoral Junction: No evidence of thrombus. Normal compressibility and flow on color Doppler imaging. Profunda Femoral Vein: No evidence of thrombus. Normal compressibility and flow on color Doppler imaging. Femoral Vein: No evidence of thrombus. Normal compressibility, respiratory phasicity and response to augmentation. Popliteal Vein: No evidence of  thrombus. Normal compressibility, respiratory phasicity and response to augmentation. Calf Veins: No evidence for thrombus in the posterior tibial vein. Peroneal vein could not be visualized. Other Findings:  None. IMPRESSION: Study limited by nonvisualization of the peroneal vein. Otherwise no evidence for acute DVT in the right lower extremity. Electronically Signed   By: Camellia Candle M.D.   On: 04/02/2024 10:25    Microbiology: Results for orders placed or performed during the hospital encounter of 04/05/24  Culture, blood (routine x 2)     Status: None (Preliminary result)   Collection Time: 04/05/24 11:46 AM   Specimen: Left Antecubital; Blood  Result Value Ref Range Status   Specimen Description LEFT ANTECUBITAL BOTTLES DRAWN AEROBIC ONLY  Final   Special Requests   Final    Blood Culture results may not be optimal due to an inadequate volume of blood received in culture bottles Performed at [facility], [address] [place], [place] [address]    Culture PENDING  Incomplete   Report Status PENDING  Incomplete  Culture, blood (routine x 2)     Status: None (Preliminary result)   Collection Time: 04/05/24  1:40 PM   Specimen: Left Antecubital; Blood  Result Value Ref Range Status   Specimen Description LEFT ANTECUBITAL BOTTLES DRAWN AEROBIC ONLY  Final   Special Requests   Final    Blood  Culture results may not be optimal due to an inadequate volume of blood received in culture bottles Performed at [facility], [address], [place], [place] [address]    Culture PENDING  Incomplete   Report Status PENDING  Incomplete    Labs: CBC: Recent Labs  Lab 04/05/24 1146 04/06/24 0432  WBC 12.0* 10.5  NEUTROABS 9.0*  --   HGB 13.3 12.3*  HCT 39.0 36.2*  MCV 93.8 94.0  PLT 228 212   Basic Metabolic Panel: Recent Labs  Lab 04/05/24 1146 04/06/24 0432  NA 138 137  K 3.6 3.8  CL 102 105  CO2 24 24  GLUCOSE 129* 144*  BUN 13 11  CREATININE 0.96 0.84  CALCIUM  9.3 8.4*    Liver Function Tests: No results for input(s): AST, ALT, ALKPHOS, BILITOT, PROT, ALBUMIN in the last 168 hours. CBG: No results for input(s): GLUCAP in the last 168 hours.  Discharge time spent: greater than 30 minutes.  Signed: Alm Schneider, MD [facility] 04/06/2024

## 2024-04-10 LAB — CULTURE, BLOOD (ROUTINE X 2)
Culture: NO GROWTH
Culture: NO GROWTH

## 2024-05-24 ENCOUNTER — Encounter: Payer: Self-pay | Admitting: Cardiovascular Disease

## 2024-05-28 MED ORDER — PEG 3350-KCL-NA BICARB-NACL 420 G PO SOLR
4000.0000 mL | Freq: Once | ORAL | 0 refills | Status: AC
Start: 2024-05-28 — End: 2024-05-28

## 2024-05-28 NOTE — Addendum Note (Signed)
 Addended by: JEANELL GRAEME RAMAN on: 05/28/2024 11:48 AM   Modules accepted: Orders

## 2024-05-30 NOTE — Telephone Encounter (Signed)
 Referral completed, [facility] apt letter sent to PCP

## 2024-05-31 ENCOUNTER — Ambulatory Visit
Admission: EM | Admit: 2024-05-31 | Discharge: 2024-05-31 | Disposition: A | Discharge status: Home / Self Care | Attending: Nurse Practitioner | Admitting: Nurse Practitioner

## 2024-05-31 ENCOUNTER — Encounter: Payer: Self-pay | Admitting: Emergency Medicine

## 2024-05-31 DIAGNOSIS — Z872 Personal history of diseases of the skin and subcutaneous tissue: Secondary | ICD-10-CM | POA: Diagnosis not present

## 2024-05-31 DIAGNOSIS — L03115 Cellulitis of right lower limb: Secondary | ICD-10-CM | POA: Diagnosis not present

## 2024-05-31 MED ORDER — DOXYCYCLINE HYCLATE 100 MG PO TABS
100.0000 mg | ORAL_TABLET | Freq: Two times a day (BID) | ORAL | 0 refills | Status: AC
Start: 2024-05-31 — End: 2024-06-07

## 2024-05-31 NOTE — ED Provider Notes (Signed)
 [facility]    CSN: [id number] Arrival date & time: 05/31/24  0950      History   Chief Complaint No chief complaint on file.   HPI [name] is a 68 y.o. male.   The history is provided by the patient.   Patient presents for complaints for concern for cellulitis in the right lower leg.  Patient states he has noticed symptoms over the past several days where he has had increased redness and swelling in the right lower leg.  Patient states that he was recently discharged from the hospital after treatment for cellulitis of the same extremity.  Patient states he was on several antibiotics, including IV antibiotics.  Patient denies reinjury, trauma, fever, chills, chest pain, abdominal pain, nausea, vomiting, diarrhea or rash.  Patient states that he does have some tingling in the right lower leg that he experienced with his prior episode of cellulitis.  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 04/07/2022   Mass of ankle, right s/p excision on 03/26/22 03/29/2022   Lipoma of right lower extremity    Nonexudative age-related macular degeneration, bilateral, early dry stage 09/02/2021   Nuclear sclerosis of both eyes 09/02/2021   Macular drusen, bilateral 07/26/2018   Dyslipidemia 07/23/2013   HTN (hypertension) 07/23/2013   Obesity (BMI 30.0-34.9) 07/23/2013   Benign prostate hyperplasia 05/01/2012   Elevated prostate specific antigen (PSA) 05/01/2012   Blepharitis 03/29/2012   Nuclear cataract 03/29/2012   Lateral epicondylitis 05/13/2008   PRIMARY LOCALIZED  OSTEOARTHROSIS LOWER LEG 08/14/2007   DEGENERATIVE JOINT DISEASE, KNEE 08/14/2007    Past Surgical History:  Procedure Laterality Date   Bilateral knee replacements  2009   COLONOSCOPY     COLONOSCOPY N/A 04/17/2014   Procedure: COLONOSCOPY;  Surgeon: Claudis RAYMOND Rivet, MD;  Location: [facility];  Service: Endoscopy;  Laterality: N/A;  730-moved to 1030  Ann to notify   Coronary Calcium  Score  12/2011   zero coronary calcium    EXCISION MASS LOWER EXTREMETIES Right 03/26/2022   Procedure: EXCISION MASS RIGHT LOWER EXTREMITY;  Surgeon: Margrette Taft BRAVO, MD;  Location: [facility];  Service: Orthopedics;  Laterality: Right;   KNEE SURGERY Right 2009, 2010   partial right knee replacement   VASECTOMY  1990       Home Medications    Prior to Admission medications   Medication Sig Start Date End Date Taking? Authorizing Provider  atorvastatin  (LIPITOR) 20 MG tablet Take 1 tablet (20 mg total) by mouth at bedtime. 06/29/23   Court Dorn PARAS, MD  Bevacizumab (AVASTIN IV) Place 1 Dose into the right eye every 5 (five) weeks. For wet AMD    [provider]  loratadine (CLARITIN) 10 MG tablet Take 10 mg by mouth daily.    [provider]  Multiple Vitamins-Minerals (PRESERVISION AREDS 2) CAPS Take 1 capsule by mouth in the morning and at bedtime.    [provider]  olmesartan (BENICAR) 40 MG tablet Take 20 mg by mouth daily.    [provider]    Family History Family History  Problem Relation Age of Onset   Heart failure Father    Diabetes Father    Atrial fibrillation Mother    Colon cancer Neg Hx     Social History Social History   Tobacco Use   Smoking status: Some Days    Current packs/day: 0.25    Average packs/day: 0.3 packs/day for 30.0 years (7.5 ttl pk-yrs)    Types: Cigarettes   Smokeless tobacco: Never  Substance Use Topics   Alcohol use: Yes    Alcohol/week: 24.0 standard drinks of alcohol    Types: 12 Cans of beer, 12  Unspecified drink type per week   Drug use: No     Allergies   Patient has no known allergies.   Review of Systems Review of Systems Per HPI  Physical Exam Triage Vital Signs ED Triage Vitals  Encounter Vitals Group     BP 05/31/24 1001 (!) 175/83     Girls Systolic BP Percentile --      Girls Diastolic BP Percentile --      Boys Systolic BP Percentile --      Boys Diastolic BP Percentile --      Pulse Rate 05/31/24 1001 70     Resp 05/31/24 1001 18     Temp 05/31/24 1001 98 F (36.7 C)     Temp Source 05/31/24 1001 Oral     SpO2 05/31/24 1001 92 %     Weight --      Height --      Head Circumference --      Peak Flow --      Pain Score 05/31/24 1004 0     Pain Loc --      Pain Education --      Exclude from Growth Chart --    No data found.  Updated Vital Signs BP (!) 175/83 (BP Location: Right Arm)   Pulse 70   Temp 98 F (36.7 C) (Oral)   Resp 18   SpO2 92%   Visual Acuity Right Eye Distance:   Left Eye Distance:   Bilateral Distance:    Right Eye Near:   Left Eye Near:    Bilateral Near:     Physical Exam Vitals and nursing note reviewed.  Constitutional:      General: He is not in acute distress.    Appearance: Normal appearance.  HENT:     Head: Normocephalic.  Eyes:     Extraocular Movements: Extraocular movements intact.     Pupils: Pupils are equal, round, and reactive to light.  Cardiovascular:     Rate and Rhythm: Normal rate and regular rhythm.     Pulses: Normal pulses.     Heart sounds: Normal heart sounds.  Pulmonary:     Effort: Pulmonary effort is normal. No respiratory distress.     Breath sounds: Normal breath sounds. No stridor. No wheezing, rhonchi or rales.  Abdominal:     General: Bowel sounds are normal.     Palpations: Abdomen is soft.  Musculoskeletal:     Cervical back: Normal range of motion.     Right lower leg: Swelling present. No deformity, lacerations or tenderness.     Comments: Swelling noted to the  right lower leg with mild erythema present.  The area is warm to palpation.  There is no oozing, fluctuance, or drainage present.  Skin:    General: Skin is warm and dry.  Neurological:  General: No focal deficit present.     Mental Status: He is alert and oriented to person, place, and time.  Psychiatric:        Mood and Affect: Mood normal.        Behavior: Behavior normal.      UC Treatments / Results  Labs (all labs ordered are listed, but only abnormal results are displayed) Labs Reviewed - No data to display  EKG   Radiology No results found.  Procedures Procedures (including critical care time)  Medications Ordered in UC Medications - No data to display  Initial Impression / Assessment and Plan / UC Course  I have reviewed the triage vital signs and the nursing notes.  Pertinent labs & imaging results that were available during my care of the patient were reviewed by me and considered in my medical decision making (see chart for details).  Patient presents for concern for cellulitis of his right lower extremity.  Patient with recent history of same.  Today, he is noted to have swelling to the right lower leg compared to his left with mild erythema and warmth to palpation.  Will treat empirically with doxycycline  100 mg twice daily for the next 7 days.  Supportive care recommendations were provided and discussed with the patient to include over-the-counter analgesics, elevating the lower extremity, and to monitor for worsening symptoms.  Patient was advised if symptoms are not improving over the next 48 to 72 hours, recommend follow-up in this clinic or with his PCP for further evaluation.  Patient was in agreement with this plan of care and verbalizes understanding.  All questions were answered.  Patient stable for discharge.  Final Clinical Impressions(s) / UC Diagnoses   Final diagnoses:  None   Discharge Instructions   None    ED Prescriptions   None     PDMP not reviewed this encounter.   Gilmer Etta PARAS, NP 05/31/24 1120

## 2024-05-31 NOTE — Discharge Instructions (Signed)
 Take medication as prescribed. Increase fluids and allow for plenty of rest. You may take over-the-counter Tylenol  as needed for pain, fever, or general discomfort. Elevate the right leg above the level of the heart is much as possible to help reduce swelling. Continue to monitor your symptoms for worsening.  If symptoms fail to improve within the next 48 to 72 hours, recommend follow-up in this clinic or with your primary care physician for further evaluation. Follow-up as needed.

## 2024-05-31 NOTE — ED Triage Notes (Signed)
 History of cellulitis in right lower leg.  Redness and swelling noticed in right lower leg x 1 week.

## 2024-06-19 ENCOUNTER — Encounter (HOSPITAL_COMMUNITY): Payer: Self-pay

## 2024-06-19 ENCOUNTER — Encounter (HOSPITAL_COMMUNITY)
Admission: RE | Admit: 2024-06-19 | Discharge: 2024-06-19 | Disposition: A | Discharge status: Home / Self Care | Source: Ambulatory Visit | Attending: Gastroenterology | Admitting: Gastroenterology

## 2024-06-19 NOTE — Progress Notes (Signed)
   06/19/24 1142  OBSTRUCTIVE SLEEP APNEA  Have you ever been diagnosed with sleep apnea through a sleep study? No  Do you snore loudly (loud enough to be heard through closed doors)?  1  Do you often feel tired, fatigued, or sleepy during the daytime (such as falling asleep during driving or talking to someone)? 0  Has anyone observed you stop breathing during your sleep? 0  Do you have, or are you being treated for high blood pressure? 1  BMI more than 35 kg/m2? 1  Age > 50 (1-yes) 1  Male Gender (Yes=1) 1  Obstructive Sleep Apnea Score 5  Score 5 or greater  Results sent to PCP

## 2024-06-21 ENCOUNTER — Telehealth (INDEPENDENT_AMBULATORY_CARE_PROVIDER_SITE_OTHER): Payer: Self-pay | Admitting: *Deleted

## 2024-06-21 ENCOUNTER — Encounter (HOSPITAL_COMMUNITY)
Admission: RE | Disposition: A | Discharge status: Home / Self Care | Payer: Self-pay | Source: Home / Self Care | Attending: Gastroenterology

## 2024-06-21 ENCOUNTER — Encounter (HOSPITAL_COMMUNITY): Payer: Self-pay | Admitting: Gastroenterology

## 2024-06-21 ENCOUNTER — Ambulatory Visit (HOSPITAL_COMMUNITY)

## 2024-06-21 ENCOUNTER — Ambulatory Visit (HOSPITAL_COMMUNITY)
Admission: RE | Admit: 2024-06-21 | Discharge: 2024-06-21 | Disposition: A | Discharge status: Home / Self Care | Attending: Gastroenterology | Admitting: Gastroenterology

## 2024-06-21 DIAGNOSIS — I2510 Atherosclerotic heart disease of native coronary artery without angina pectoris: Secondary | ICD-10-CM

## 2024-06-21 DIAGNOSIS — K648 Other hemorrhoids: Secondary | ICD-10-CM | POA: Insufficient documentation

## 2024-06-21 DIAGNOSIS — I10 Essential (primary) hypertension: Secondary | ICD-10-CM

## 2024-06-21 DIAGNOSIS — M1990 Unspecified osteoarthritis, unspecified site: Secondary | ICD-10-CM | POA: Insufficient documentation

## 2024-06-21 DIAGNOSIS — Z1211 Encounter for screening for malignant neoplasm of colon: Secondary | ICD-10-CM | POA: Diagnosis not present

## 2024-06-21 DIAGNOSIS — K644 Residual hemorrhoidal skin tags: Secondary | ICD-10-CM | POA: Insufficient documentation

## 2024-06-21 DIAGNOSIS — F17200 Nicotine dependence, unspecified, uncomplicated: Secondary | ICD-10-CM | POA: Diagnosis not present

## 2024-06-21 DIAGNOSIS — K5730 Diverticulosis of large intestine without perforation or abscess without bleeding: Secondary | ICD-10-CM | POA: Diagnosis not present

## 2024-06-21 HISTORY — PX: COLONOSCOPY: SHX5424

## 2024-06-21 SURGERY — COLONOSCOPY
Anesthesia: General

## 2024-06-21 MED ORDER — LACTATED RINGERS IV SOLN: INTRAVENOUS | Status: DC

## 2024-06-21 MED ORDER — PROPOFOL 500 MG/50ML IV EMUL
INTRAVENOUS | Status: DC | PRN
  Administered 2024-06-21: 200 mg via INTRAVENOUS
  Administered 2024-06-21: 150 ug/kg/min via INTRAVENOUS

## 2024-06-21 NOTE — Telephone Encounter (Signed)
-----   Message from Deatrice JULIANNA Dine sent at 06/21/2024 11:33 AM EDT ----- Regarding: winnie Erskin Caldron ,  Can you please arrange a referral for this patient to Colorectal surgery  Diagnosis:Large External Hemorrhoids    Thanks,  Muhammad Faizan Ahmed, MD Gastroenterology and Hepatology [facility]

## 2024-06-21 NOTE — Anesthesia Postprocedure Evaluation (Signed)
 Anesthesia Post Note  Patient: [name]  Procedure(s) Performed: COLONOSCOPY  Patient location during evaluation: PACU Anesthesia Type: General Level of consciousness: awake and alert Pain management: pain level controlled Vital Signs Assessment: post-procedure vital signs reviewed and stable Respiratory status: spontaneous breathing, nonlabored ventilation, respiratory function stable and patient connected to nasal cannula oxygen Cardiovascular status: blood pressure returned to baseline and stable Postop Assessment: no apparent nausea or vomiting Anesthetic complications: no   No notable events documented.   Last Vitals:  Vitals:   06/21/24 0910 06/21/24 1112  BP: (!) 150/84 (!) 149/63  Pulse:  83  Resp: 16 18  Temp: 36.8 C 36.7 C  SpO2: 99% 98%    Last Pain:  Vitals:   06/21/24 1112  TempSrc: Oral  PainSc: 0-No pain                 Andrea Limes

## 2024-06-21 NOTE — Telephone Encounter (Signed)
 Referral sent, they will contact patient with apt

## 2024-06-21 NOTE — Anesthesia Procedure Notes (Signed)
 Date/Time: 06/21/2024 10:46 AM  Performed by: Barbarann Verneita RAMAN, CRNAPre-anesthesia Checklist: Patient identified, Emergency Drugs available, Suction available, Timeout performed and Patient being monitored Patient Re-evaluated:Patient Re-evaluated prior to induction Oxygen Delivery Method: Nasal Cannula

## 2024-06-21 NOTE — Discharge Instructions (Signed)

## 2024-06-21 NOTE — H&P (Signed)
 Primary Care Physician:  Marvine Rush, MD Primary Gastroenterologist:  Dr. Cinderella  Pre-Procedure History & Physical: HPI:  [name] is a 68 y.o. male is here for a colonoscopy for colon cancer screening purposes.  Patient denies any family history of colorectal cancer.  No melena or hematochezia.  No abdominal pain or unintentional weight loss.  No change in bowel habits.  Overall feels well from a GI standpoint.  Past Medical History:  Diagnosis Date   Dyslipidemia    Family history of heart disease    father died @ 45 of HF   Hypertension    OA (osteoarthritis)    Obesity     Past Surgical History:  Procedure Laterality Date   Bilateral knee replacements  2009   COLONOSCOPY     COLONOSCOPY N/A 04/17/2014   Procedure: COLONOSCOPY;  Surgeon: Claudis RAYMOND Rivet, MD;  [facility]: [facility];  Service: Endoscopy;  Laterality: N/A;  730-moved to 1030  Ann to notify   Coronary Calcium  Score  12/2011   zero coronary calcium    EXCISION MASS LOWER EXTREMETIES Right 03/26/2022   Procedure: EXCISION MASS RIGHT LOWER EXTREMITY;  Surgeon: Margrette Taft BRAVO, MD;  Location: [facility];  Service: Orthopedics;  Laterality: Right;   KNEE SURGERY Right 2009, 2010   partial right knee replacement   VASECTOMY  1990    Prior to Admission medications   Medication Sig Start Date End Date Taking? Authorizing Provider  atorvastatin  (LIPITOR) 20 MG tablet Take 1 tablet (20 mg total) by mouth at bedtime. 06/29/23  Yes Court Dorn PARAS, MD  loratadine (CLARITIN) 10 MG tablet Take 10 mg by mouth daily.   Yes [provider]  Multiple Vitamins-Minerals (PRESERVISION AREDS 2) CAPS Take 1 capsule by mouth in the morning and at bedtime.   Yes [provider]  olmesartan (BENICAR) 40 MG tablet Take 20 mg by mouth daily.   Yes [provider]  Bevacizumab (AVASTIN IV) Place 1 Dose into the right eye every 5 (five) weeks. For wet AMD    [provider]    Allergies as of  05/28/2024   (No Known Allergies)    Family History  Problem Relation Age of Onset   Heart failure Father    Diabetes Father    Atrial fibrillation Mother    Colon cancer Neg Hx     Social History   Socioeconomic History   Marital status: Married    Spouse name: Not on file   Number of children: 2   Years of education: B.S.   Highest education level: Not on file  Occupational History   Occupation: Agricultural engineer: [employer]  Tobacco Use   Smoking status: Some Days    Current packs/day: 0.25    Average packs/day: 0.3 packs/day for 30.0 years (7.5 ttl pk-yrs)    Types: Cigarettes   Smokeless tobacco: Never  Substance and Sexual Activity   Alcohol use: Yes    Alcohol/week: 24.0 standard drinks of alcohol    Types: 12 Cans of beer, 12 Unspecified drink type per week   Drug use: No   Sexual activity: Yes  Other Topics Concern   Not on file  Social History Narrative   Not on file   Social Drivers of Health   Financial Resource Strain: Not on file  Food Insecurity: No Food Insecurity (04/06/2024)   Hunger Vital Sign    Worried About Running Out of Food in the Last Year: Never true  Ran Out of Food in the Last Year: Never true  Transportation Needs: No Transportation Needs (04/06/2024)   PRAPARE - Administrator, Civil Service (Medical): No    Lack of Transportation (Non-Medical): No  Physical Activity: Not on file  Stress: Not on file  Social Connections: Socially Integrated (04/06/2024)   Social Connection and Isolation Panel    Frequency of Communication with Friends and Family: More than three times a week    Frequency of Social Gatherings with Friends and Family: More than three times a week    Attends Religious Services: More than 4 times per year    Active Member of [employer] or Organizations: Yes    Attends Engineer, structural: More than 4 times per year    Marital Status: Married  Catering manager Violence: Not At Risk  (04/06/2024)   Humiliation, Afraid, Rape, and Kick questionnaire    Fear of Current or Ex-Partner: No    Emotionally Abused: No    Physically Abused: No    Sexually Abused: No    Review of Systems: See HPI, otherwise negative ROS  Physical Exam: Vital signs in last 24 hours: Temp:  [98.3 F (36.8 C)] 98.3 F (36.8 C) (10/09 0910) Resp:  [16] 16 (10/09 0910) BP: (150)/(84) 150/84 (10/09 0910) SpO2:  [99 %] 99 % (10/09 0910)   General:   Alert,  Well-developed, well-nourished, pleasant and cooperative in NAD Head:  Normocephalic and atraumatic. Eyes:  Sclera clear, no icterus.   Conjunctiva pink. Ears:  Normal auditory acuity. Nose:  No deformity, discharge,  or lesions. Msk:  Symmetrical without gross deformities. Normal posture. Extremities:  Without clubbing or edema. Neurologic:  Alert and  oriented x4;  grossly normal neurologically. Skin:  Intact without significant lesions or rashes. Psych:  Alert and cooperative. Normal mood and affect.  Impression/Plan: [name] is here for a colonoscopy to be performed for colon cancer screening purposes.  The risks of the procedure including infection, bleed, or perforation as well as benefits, limitations, alternatives and imponderables have been reviewed with the patient. Questions have been answered. All parties agreeable.

## 2024-06-21 NOTE — Anesthesia Preprocedure Evaluation (Addendum)
 Anesthesia Evaluation  Patient identified by MRN, date of birth, ID band Patient awake    Reviewed: Allergy & Precautions, H&P , NPO status , Patient's Chart, lab work & pertinent test results  Airway Mallampati: III  TM Distance: >3 FB Neck ROM: Full    Dental  (+) Poor Dentition   Pulmonary Current Smoker   Pulmonary exam normal breath sounds clear to auscultation       Cardiovascular hypertension, + CAD  Normal cardiovascular exam Rhythm:Regular Rate:Normal     Neuro/Psych negative neurological ROS  negative psych ROS   GI/Hepatic negative GI ROS,,,(+)     substance abuse  alcohol use24 drinks/week   Endo/Other  negative endocrine ROS    Renal/GU negative Renal ROS  negative genitourinary   Musculoskeletal  (+) Arthritis ,    Abdominal   Peds negative pediatric ROS (+)  Hematology negative hematology ROS (+)   Anesthesia Other Findings   Reproductive/Obstetrics negative OB ROS                              Anesthesia Physical Anesthesia Plan  ASA: 2  Anesthesia Plan: General   Post-op Pain Management:    Induction: Intravenous  PONV Risk Score and Plan:   Airway Management Planned: Nasal Cannula  Additional Equipment:   Intra-op Plan:   Post-operative Plan:   Informed Consent: I have reviewed the patients History and Physical, chart, labs and discussed the procedure including the risks, benefits and alternatives for the proposed anesthesia with the patient or authorized representative who has indicated his/her understanding and acceptance.     Dental advisory given  Plan Discussed with: CRNA  Anesthesia Plan Comments:          Anesthesia Quick Evaluation

## 2024-06-21 NOTE — Transfer of Care (Signed)
 Immediate Anesthesia Transfer of Care Note  Patient: [name]  Procedure(s) Performed: COLONOSCOPY  Patient Location: Short Stay  Anesthesia Type:General  Level of Consciousness: awake and patient cooperative  Airway & Oxygen Therapy: Patient Spontanous Breathing  Post-op Assessment: Report given to RN and Post -op Vital signs reviewed and stable  Post vital signs: Reviewed and stable  Last Vitals:  Vitals Value Taken Time  BP 149/63 06/21/24 11:12  Temp 36.7 C 06/21/24 11:12  Pulse 83 06/21/24 11:12  Resp 18 06/21/24 11:12  SpO2 98 % 06/21/24 11:12    Last Pain:  Vitals:   06/21/24 1112  TempSrc: Oral  PainSc: 0-No pain         Complications: No notable events documented.

## 2024-06-21 NOTE — Op Note (Signed)
 [facility] Patient Name: [name] Procedure Date: 06/21/2024 10:36 AM MRN: [id number] Date of Birth: 06-21-1956 Attending MD: Deatrice Dine , MD, [id number] CSN: [id number] Age: 68 Admit Type: Outpatient Procedure:                Colonoscopy Indications:              Screening for colorectal malignant neoplasm Providers:                Deatrice Dine, MD, Harlene Lips, Devere Lodge Referring MD:              Medicines:                Monitored Anesthesia Care Complications:            No immediate complications. Estimated Blood Loss:     Estimated blood loss: none. Procedure:                Pre-Anesthesia Assessment:                           - Prior to the procedure, a History and Physical                            was performed, and patient medications and                            allergies were reviewed. The patient's tolerance of                            previous anesthesia was also reviewed. The risks                            and benefits of the procedure and the sedation                            options and risks were discussed with the patient.                            All questions were answered, and informed consent                            was obtained. Prior Anticoagulants: The patient has                            taken no anticoagulant or antiplatelet agents. ASA                            Grade Assessment: II - A patient with mild systemic                            disease. After reviewing the risks and benefits,                            the patient was deemed in satisfactory condition to  undergo the procedure.                           After obtaining informed consent, the colonoscope                            was passed under direct vision. Throughout the                            procedure, the patient's blood pressure, pulse, and                            oxygen saturations were monitored continuously.  The                            [id number] ([id number]) Colon was introduced through                            the anus and advanced to the the cecum, identified                            by appendiceal orifice and ileocecal valve. The                            colonoscopy was performed without difficulty. The                            patient tolerated the procedure well. The quality                            of the bowel preparation was evaluated using the                            BBPS [facility] Bowel Preparation Scale) with scores                            of: Right Colon = 3, Transverse Colon = 3 and Left                            Colon = 3 (entire mucosa seen well with no residual                            staining, small fragments of stool or opaque                            liquid). The total BBPS score equals 9. The                            ileocecal valve, appendiceal orifice, and rectum                            were photographed. Scope In: 10:53:02 AM Scope Out: 11:07:23 AM Scope Withdrawal Time: 0 hours 11 minutes 49 seconds  Total Procedure Duration: 0 hours 14 minutes 21 seconds  Findings:      A few small-mouthed diverticula were found in the left colon.      Non-bleeding external and internal hemorrhoids were found during       retroflexion. The hemorrhoids were medium-sized.      Hemorrhoids were found on perianal exam. Impression:               - Diverticulosis in the left colon.                           - Non-bleeding external and internal hemorrhoids.                           - Hemorrhoids found on perianal exam.                           - No specimens collected. Moderate Sedation:      Per Anesthesia Care Recommendation:           - Patient has a contact number available for                            emergencies. The signs and symptoms of potential                            delayed complications were discussed with the                             patient. Return to normal activities tomorrow.                            Written discharge instructions were provided to the                            patient.                           - Resume previous diet.                           - Continue present medications.                           - Repeat colonoscopy in 10 years for screening                            purposes.                           - Return to primary care physician as previously                            scheduled.                           -Consider colorectal surgery evaluation for  external hemorrhoids Procedure Code(s):        --- Professional ---                           H9878, Colorectal cancer screening; colonoscopy on                            individual not meeting criteria for high risk Diagnosis Code(s):        --- Professional ---                           Z12.11, Encounter for screening for malignant                            neoplasm of colon                           K64.8, Other hemorrhoids                           K57.30, Diverticulosis of large intestine without                            perforation or abscess without bleeding CPT copyright 2022 American Medical Association. All rights reserved. The codes documented in this report are preliminary and upon coder review may  be revised to meet current compliance requirements. Deatrice Dine, MD Deatrice Dine, MD 06/21/2024 11:14:06 AM This report has been signed electronically. Number of Addenda: 0

## 2024-06-25 ENCOUNTER — Encounter (HOSPITAL_COMMUNITY): Payer: Self-pay | Admitting: Gastroenterology

## 2024-07-02 ENCOUNTER — Encounter: Payer: Self-pay | Admitting: Orthopedic Surgery

## 2024-07-02 ENCOUNTER — Other Ambulatory Visit (INDEPENDENT_AMBULATORY_CARE_PROVIDER_SITE_OTHER)

## 2024-07-02 ENCOUNTER — Ambulatory Visit: Admitting: Orthopedic Surgery

## 2024-07-02 VITALS — BP 161/103 | HR 73 | Ht 71.0 in | Wt 248.0 lb

## 2024-07-02 DIAGNOSIS — S86819A Strain of other muscle(s) and tendon(s) at lower leg level, unspecified leg, initial encounter: Secondary | ICD-10-CM

## 2024-07-02 DIAGNOSIS — M25572 Pain in left ankle and joints of left foot: Secondary | ICD-10-CM

## 2024-07-02 NOTE — Patient Instructions (Signed)
 You probably have a tear of the inferior peroneal retinaculum which should heal on its own  Avoid uneven ground  Wear supportive shoes  Should be self-limiting

## 2024-07-02 NOTE — Progress Notes (Signed)
   Chief Complaint  Patient presents with   Foot Pain    left / popped while walking x 4 weeks ago     History 68 year old male was walking out of the shower something popped in his foot.  He complains of discomfort distal to the fibula but proximal to the fifth metatarsal Pain increases with eversion  Review of systems he denies any numbness or tingling Reports cellulitis right leg treated with antibiotics.  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 currently asymptomatic  Exam left foot Fibula nontender metatarsal nontender Tenderness over the peroneals Pain with resisted eversion without weakness Stable anterior drawer No skin lesions Color capillary refill normal No sensory changes  DG Foot Complete Left Result Date: 07/02/2024 X-ray left foot The patient felt a pop when stepping out of the shower about 4 weeks ago There is no pain in the bone of the fibula or metatarsal Degenerative changes are seen in the first metatarsal phalangeal joint the fibula and fifth metatarsal no fracture normal calcaneal pitch no midfoot arthritis Impression first metatarsophalangeal joint degenerative arthritis no acute fracture    Encounter Diagnoses  Name Primary?   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

## 2024-07-02 NOTE — Progress Notes (Signed)
  Intake history:  Chief Complaint  Patient presents with   Foot Pain    Right / popped while walking x 4 weeks ago      Ht 5' 11 (1.803 m)   Wt 248 lb (112.5 kg)   BMI 34.59 kg/m  Body mass index is 34.59 kg/m.  Pharmacy? ___[place] ___________________________________  WHAT ARE WE SEEING YOU FOR TODAY?   Foot left   How long has this bothered you? (DOI?DOS?WS?)  4 weeks ago popped while walking [address] [place] area   Was there an injury? Yes  Anticoag.  No  Diabetes No  Heart disease No  Hypertension Yes  SMOKING HX Yes  Kidney disease No  Any ALLERGIES ______________No Known Allergies ________________________________   Treatment:  Have you taken:  Tylenol  No  Advil No  Had PT No  Had injection No  Other  _________________________

## 2024-07-25 ENCOUNTER — Ambulatory Visit: Attending: Cardiovascular Disease | Admitting: Cardiovascular Disease

## 2024-07-25 ENCOUNTER — Ambulatory Visit: Payer: Self-pay | Admitting: Surgery

## 2024-07-25 ENCOUNTER — Encounter: Payer: Self-pay | Admitting: Cardiovascular Disease

## 2024-07-25 VITALS — BP 140/78 | HR 64 | Ht 71.0 in | Wt 253.8 lb

## 2024-07-25 DIAGNOSIS — R931 Abnormal findings on diagnostic imaging of heart and coronary circulation: Secondary | ICD-10-CM

## 2024-07-25 DIAGNOSIS — E785 Hyperlipidemia, unspecified: Secondary | ICD-10-CM

## 2024-07-25 DIAGNOSIS — I2510 Atherosclerotic heart disease of native coronary artery without angina pectoris: Secondary | ICD-10-CM | POA: Diagnosis not present

## 2024-07-25 DIAGNOSIS — E66811 Obesity, class 1: Secondary | ICD-10-CM | POA: Diagnosis not present

## 2024-07-25 DIAGNOSIS — I10 Essential (primary) hypertension: Secondary | ICD-10-CM

## 2024-07-25 NOTE — Assessment & Plan Note (Signed)
 BMI of 35.  We did talk about GLP-1's.  He wishes to pursue lifestyle modification first.

## 2024-07-25 NOTE — Assessment & Plan Note (Signed)
 Elevated coronary calcium  score of 56 in the LAD and RCA territory measured 07/10/2023.  He is at goal for secondary prevention and is asymptomatic.

## 2024-07-25 NOTE — Patient Instructions (Signed)
 Medication Instructions:  Your physician recommends that you continue on your current medications as directed. Please refer to the Current Medication list given to you today.  *If you need a refill on your cardiac medications before your next appointment, please call your pharmacy*  Follow-Up: At [facility], you and your health needs are our priority.  As part of our continuing mission to provide you with exceptional heart care, our providers are all part of one team.  This team includes your primary Cardiologist (physician) and Advanced Practice Providers or APPs (Physician Assistants and Nurse Practitioners) who all work together to provide you with the care you need, when you need it.  Your next appointment:   1 year(s)  Provider:   Dorn Lesches, MD  We recommend signing up for the patient portal called MyChart.  Sign up information is provided on this After Visit Summary.  MyChart is used to connect with patients for Virtual Visits (Telemedicine).  Patients are able to view lab/test results, encounter notes, upcoming appointments, etc.  Non-urgent messages can be sent to your provider as well.    To learn more about what you can do with MyChart, go to [email or web address].   Other Instructions

## 2024-07-25 NOTE — Progress Notes (Signed)
 07/25/2024 [name]   Mar 04, 1956  [id number]  Primary Physician Marvine Rush, MD Primary Cardiologist: Dorn JINNY Lesches MD [place] [place], [place]  HPI:  [name] is a 68 y.o.  mildly overweight married Caucasian male father of 3 children, grandfather of 1 grandchild who is currently retired.  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  He was referred by Dr. Rush Marvine in [place] to be established because of risk factors.  I last saw him in the office 05/31/2023.  He has seen Dr. Mona remotely back in 2014 with similar problems of obesity, hypertension and dyslipidemia.  He did have a coronary calcium  score at that time which was 0.  He has smoked off and on over the years but stopped back in June when he had a chest CT that showed coronary calcification.  He drinks beer on a daily basis.  He does have treated hypertension and hyperlipidemia.  There is no family history of heart disease.  He has never had a heart attack or stroke.  He denies chest pain or shortness of breath.  He does play golf and works out on the treadmill without symptoms.  Since I saw him a year ago he remained stable.  He denies chest pain or shortness of breath.  Did have a coronary calcium  score performed 07/10/2023 which was 56 located in the LAD and RCA territories.  I did double his atorvastatin  dose with a recent lipid profile performed 10/25/2023 revealing total cholesterol 123, LDL 63 and HDL 46, at goal for secondary prevention.  Current Meds  Medication Sig   atorvastatin  (LIPITOR) 20 MG tablet Take 1 tablet (20 mg total) by mouth at bedtime.   Bevacizumab (AVASTIN IV) Place 1 Dose into the right eye every 5 (five) weeks. For wet AMD   loratadine (CLARITIN) 10 MG tablet Take 10 mg by mouth daily.   Multiple Vitamins-Minerals (PRESERVISION AREDS 2) CAPS Take 1 capsule by mouth in the morning and at bedtime.   olmesartan (BENICAR)  40 MG tablet Take 20 mg by mouth daily.   Omega-3 Fatty Acids (FISH OIL CONCENTRATE PO) Take by mouth.     No Known Allergies  Social History   Socioeconomic History   Marital status: Married    Spouse name: Not on file   Number of children: 2   Years of education: B.S.   Highest education level: Not on file  Occupational History   Occupation: Agricultural Engineer: [employer]  Tobacco Use   Smoking status: Some Days    Current packs/day: 0.25    Average packs/day: 0.3 packs/day for 30.0 years (7.5 ttl pk-yrs)    Types: Cigarettes   Smokeless tobacco: Never  Substance and Sexual Activity   Alcohol use: Yes    Alcohol/week: 24.0 standard drinks of alcohol    Types: 12 Cans of beer, 12 Unspecified drink type per week   Drug use: No   Sexual activity: Yes  Other Topics Concern   Not on file  Social History Narrative   Not on file   Social Drivers of Health   Financial Resource Strain: Not on file  Food Insecurity: No Food Insecurity (04/06/2024)   Hunger Vital Sign    Worried About Running Out of Food in the Last Year: Never true    Ran Out of Food in the Last Year: Never true  Transportation  Needs: No Transportation Needs (04/06/2024)   PRAPARE - Administrator, Civil Service (Medical): No    Lack of Transportation (Non-Medical): No  Physical Activity: Not on file  Stress: Not on file  Social Connections: Socially Integrated (04/06/2024)   Social Connection and Isolation Panel    Frequency of Communication with Friends and Family: More than three times a week    Frequency of Social Gatherings with Friends and Family: More than three times a week    Attends Religious Services: More than 4 times per year    Active Member of [employer] or Organizations: Yes    Attends Engineer, Structural: More than 4 times per year    Marital Status: Married  Catering Manager Violence: Not At Risk (04/06/2024)   Humiliation, Afraid, Rape, and Kick questionnaire     Fear of Current or Ex-Partner: No    Emotionally Abused: No    Physically Abused: No    Sexually Abused: No     Review of Systems: General: negative for chills, fever, night sweats or weight changes.  Cardiovascular: negative for chest pain, dyspnea on exertion, edema, orthopnea, palpitations, paroxysmal nocturnal dyspnea or shortness of breath Dermatological: negative for rash Respiratory: negative for cough or wheezing Urologic: negative for hematuria Abdominal: negative for nausea, vomiting, diarrhea, bright red blood per rectum, melena, or hematemesis Neurologic: negative for visual changes, syncope, or dizziness All other systems reviewed and are otherwise negative except as noted above.    Blood pressure (!) 140/78, pulse 64, height 5' 11 (1.803 m), weight 253 lb 12.8 oz (115.1 kg), SpO2 96%.  General appearance: alert and no distress Neck: no adenopathy, no carotid bruit, no JVD, supple, symmetrical, trachea midline, and thyroid not enlarged, symmetric, no tenderness/mass/nodules Lungs: clear to auscultation bilaterally Heart: regular rate and rhythm, S1, S2 normal, no murmur, click, rub or gallop Extremities: extremities normal, atraumatic, no cyanosis or edema Pulses: 2+ and symmetric Skin: Skin color, texture, turgor normal. No rashes or lesions Neurologic: Grossly normal  EKG EKG Interpretation Date/Time:  Wednesday July 25 2024 13:26:08 EST Ventricular Rate:  64 PR Interval:  162 QRS Duration:  130 QT Interval:  422 QTC Calculation: 435 R Axis:   68  Text Interpretation: Sinus rhythm with Premature atrial complexes Right bundle branch block When compared with ECG of 31-May-2023 09:46, Premature atrial complexes are now Present Confirmed by Court Carrier [phone]) on 07/25/2024 1:40:39 PM    ASSESSMENT AND PLAN:   Dyslipidemia History of dyslipidemia on statin therapy with lipid profile performed 10/25/2023 revealing total cholesterol 123, LDL of 63 and HDL  of 46.  He is at goal for secondary prevention given his mildly elevated coronary calcium  score.  HTN (hypertension) History of of essential hypertension with blood pressure measured today at 140/78.  He is on 20 mg of Benicar.  Obesity (BMI 30.0-34.9) BMI of 35.  We did talk about GLP-1's.  He wishes to pursue lifestyle modification first.  Elevated coronary artery calcium  score Elevated coronary calcium  score of 56 in the LAD and RCA territory measured 07/10/2023.  He is at goal for secondary prevention and is asymptomatic.     Carrier DOROTHA Court MD FACP,FACC,FAHA, [facility] 07/25/2024 1:54 PM

## 2024-07-25 NOTE — Assessment & Plan Note (Signed)
 History of dyslipidemia on statin therapy with lipid profile performed 10/25/2023 revealing total cholesterol 123, LDL of 63 and HDL of 46.  He is at goal for secondary prevention given his mildly elevated coronary calcium  score.

## 2024-07-25 NOTE — Assessment & Plan Note (Signed)
 History of of essential hypertension with blood pressure measured today at 140/78.  He is on 20 mg of Benicar.

## 2024-08-24 ENCOUNTER — Other Ambulatory Visit

## 2024-08-24 ENCOUNTER — Encounter: Payer: Self-pay | Admitting: Orthopedic Surgery

## 2024-08-24 ENCOUNTER — Ambulatory Visit: Admitting: Orthopedic Surgery

## 2024-08-24 VITALS — BP 135/70 | HR 80 | Ht 71.0 in | Wt 244.0 lb

## 2024-08-24 DIAGNOSIS — M25851 Other specified joint disorders, right hip: Secondary | ICD-10-CM | POA: Diagnosis not present

## 2024-08-24 DIAGNOSIS — M25551 Pain in right hip: Secondary | ICD-10-CM

## 2024-08-24 MED ORDER — MELOXICAM 7.5 MG PO TABS: 7.5000 mg | ORAL_TABLET | Freq: Every day | ORAL | 5 refills | Status: AC

## 2024-08-24 NOTE — Progress Notes (Signed)
°  Intake history:  Chief Complaint  Patient presents with   Leg Pain    Right Thigh    Hip Pain    Right groin painful      BP 135/70   Pulse 80   Ht 5' 11 (1.803 m)   Wt 244 lb (110.7 kg)   BMI 34.03 kg/m  Body mass index is 34.03 kg/m.  Pharmacy? _____[place]_________________________________  WHAT ARE WE SEEING YOU FOR TODAY?   Right hip  How long has this bothered you? (DOI?DOS?WS?)  4 week(s) ago  Was there an injury? No  Anticoag.  No   Any ALLERGIES ______NKDA_ _______________________________________   Treatment:  Have you taken:  Tylenol  No  Advil Yes/ TAKES 800 MG HELPS  Had PT No  Had injection No  Other  _________________________

## 2024-08-24 NOTE — Patient Instructions (Signed)
 Physical therapy has been ordered for you at [facility]. They should call you to schedule, [phone] is the phone number to call, if you want to call to schedule.

## 2024-08-24 NOTE — Progress Notes (Signed)
 Patient ID: [name], male   DOB: 1956/02/15, 68 y.o.   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  No history of trauma.  Symptoms present for approximately 1 month  The patient notes that he got up from a seated position and it took him a while to get going.  He is also having some issues getting out of his truck.    Allergies[1]  Current Outpatient Medications  Medication Instructions   atorvastatin  (LIPITOR) 20 mg, Oral, Daily at bedtime   Bevacizumab (AVASTIN IV) 1 Dose, Every 5 weeks   loratadine (CLARITIN) 10 mg, Daily   meloxicam (MOBIC) 7.5 mg, Oral, Daily   Multiple Vitamins-Minerals (PRESERVISION AREDS 2) CAPS 1 capsule, 2 times daily   olmesartan (BENICAR) 20 mg, Daily   Omega-3 Fatty Acids (FISH OIL CONCENTRATE PO) Take by mouth.     Review of Systems Review of Systems shoulder pain after splitting wood yesterday   Past Medical History:  Diagnosis Date   Dyslipidemia    Family history of heart disease    father died @ 73 of HF   Hypertension    OA (osteoarthritis)    Obesity     Past Surgical History:  Procedure Laterality Date   Bilateral knee replacements  2009   COLONOSCOPY     COLONOSCOPY N/A 04/17/2014   Procedure: COLONOSCOPY;  Surgeon: Claudis RAYMOND Rivet, MD;  Location: [facility];  Service: Endoscopy;  Laterality: N/A;  730-moved to 1030  Ann to notify   COLONOSCOPY N/A 06/21/2024   Procedure: COLONOSCOPY;  Surgeon: Cinderella Deatrice FALCON, MD;  Location: [facility]  ENDO SUITE;  Service: Endoscopy;  Laterality: N/A;  10:15am, asa 1-2   Coronary Calcium  Score  12/2011   zero coronary calcium    EXCISION MASS LOWER EXTREMETIES Right 03/26/2022   Procedure: EXCISION MASS RIGHT LOWER EXTREMITY;  Surgeon: Margrette Taft BRAVO, MD;  Location: [facility] ORS;  Service: Orthopedics;  Laterality: Right;   KNEE SURGERY Right 2009, 2010   partial right knee replacement   VASECTOMY  1990    Family History  Problem Relation Age of Onset   Heart failure Father    Diabetes Father    Atrial fibrillation Mother    Colon cancer Neg Hx      Social History[2]  Allergies[3]  Current Outpatient Medications  Medication Sig Dispense Refill   meloxicam (MOBIC) 7.5 MG tablet Take 1 tablet (7.5 mg total) by mouth daily. 30 tablet 5   atorvastatin  (LIPITOR) 20 MG tablet Take 1 tablet (20 mg total) by mouth at bedtime. 90 tablet 3   Bevacizumab (AVASTIN IV) Place 1 Dose into the right eye every 5 (five) weeks. For wet AMD     loratadine (CLARITIN) 10 MG tablet Take 10 mg by mouth daily.     Multiple Vitamins-Minerals (PRESERVISION AREDS 2) CAPS Take 1 capsule by mouth in the morning and at bedtime.  olmesartan (BENICAR) 40 MG tablet Take 20 mg by mouth daily.     Omega-3 Fatty Acids (FISH OIL CONCENTRATE PO) Take by mouth.     No current facility-administered medications for this visit.       Physical Exam BP 135/70   Pulse 80   Ht 5' 11 (1.803 m)   Wt 244 lb (110.7 kg)   BMI 34.03 kg/m    General appearance normal  Alert and oriented 3  Mood and affect normal    Upper extremities: Noncontributory  Ambulatory status: LIMP: y/n no  lumbar spine exam negative  Examination of the right hip reveals no tenderness over the greater trochanter  Hip flexion is 130 Internal rotation is 25 External rotation is 35 Hip stability is normal  Log roll maneuver hip is in external rotation discomfort with internal rotation Push pull test is normal  Hip flexion  strength 5/5 Leg lengths are equal  Overall alignment is normal Neurovascular examination normal sensation in the 2 lower extremities with normal pulses and perfusion, no peripheral edema, normal color  MEDICAL DECISION SECTION  OUTSIDE NOTES (Y/N) no  SUMMARY OF OUTSIDE NOTES: Noncontributory    Imaging:   DG HIP UNILAT WITH PELVIS 2-3 VIEWS RIGHT Result Date: 08/24/2024 Imaging of the right hip Patient with groin pain and lateral thigh pain X-ray shows developing pistol-grip deformity of the proximal femur and decreased femoral head-neck offset on his [facility] x-ray no significant joint space narrowing is seen femoral head contour looks normal. Impression FAI bone changes Further imaging may be necessary to rule out labral tear in addition to the probable cartilage degeneration     Encounter Diagnoses  Name Primary?   Pain in right hip    Femoroacetabular impingement of right hip Yes     PLAN:   Meds ordered this encounter  Medications   meloxicam (MOBIC) 7.5 MG tablet    Sig: Take 1 tablet (7.5 mg total) by mouth daily.    Dispense:  30 tablet    Refill:  5    Physical therapy follow-up in 3 months weight loss for exercise    [1] No Known Allergies [2]  Social History Tobacco Use   Smoking status: Some Days    Current packs/day: 0.25    Average packs/day: 0.3 packs/day for 30.0 years (7.5 ttl pk-yrs)    Types: Cigarettes   Smokeless tobacco: Never  Substance Use Topics   Alcohol use: Yes    Alcohol/week: 24.0 standard drinks of alcohol    Types: 12 Cans of beer, 12 Unspecified drink type per week   Drug use: No  [3] No Known Allergies

## 2024-08-24 NOTE — Addendum Note (Signed)
 Addended byBETHA JENEAN GREIG LELON on: 08/24/2024 09:10 AM   Modules accepted: Orders

## 2024-10-01 ENCOUNTER — Ambulatory Visit (HOSPITAL_COMMUNITY): Attending: Orthopedic Surgery

## 2024-10-01 ENCOUNTER — Encounter (HOSPITAL_COMMUNITY): Payer: Self-pay

## 2024-10-01 DIAGNOSIS — M25551 Pain in right hip: Secondary | ICD-10-CM | POA: Insufficient documentation

## 2024-10-01 DIAGNOSIS — M25851 Other specified joint disorders, right hip: Secondary | ICD-10-CM | POA: Insufficient documentation

## 2024-10-01 NOTE — Therapy (Addendum)
 " OUTPATIENT PHYSICAL THERAPY LOWER EXTREMITY EVALUATION   Patient Name: [name] MRN: [id number] DOB:[birth year], 69 y.o., male Today's Date: 10/01/2024  END OF SESSION:  PT End of Session - 10/01/24 0734     Visit Number 1    Number of Visits 4    Date for Recertification  11/09/24    Authorization Type [employer]    Authorization Time Period seeking authorization    PT Start Time [phone]    PT Stop Time 0807    PT Time Calculation (min) 33 min    Activity Tolerance Patient tolerated treatment well    Behavior During Therapy [facility] for tasks assessed/performed          Past Medical History:  Diagnosis Date   Dyslipidemia    Family history of heart disease    father died @ 25 of HF   Hypertension    OA (osteoarthritis)    Obesity    Past Surgical History:  Procedure Laterality Date   Bilateral knee replacements  2009   COLONOSCOPY     COLONOSCOPY N/A 04/17/2014   Procedure: COLONOSCOPY;  Surgeon: Claudis RAYMOND Rivet, MD;  Location: [facility];  Service: Endoscopy;  Laterality: N/A;  730-moved to 1030  Ann to notify   COLONOSCOPY N/A 06/21/2024   Procedure: COLONOSCOPY;  Surgeon: Cinderella Deatrice FALCON, MD;  Location: [facility];  Service: Endoscopy;  Laterality: N/A;  10:15am, asa 1-2   Coronary Calcium  Score  12/2011   zero coronary calcium    EXCISION MASS LOWER EXTREMETIES Right 03/26/2022   Procedure: EXCISION MASS RIGHT LOWER EXTREMITY;  Surgeon: Margrette Taft BRAVO, MD;  Location: [facility];  Service: Orthopedics;  Laterality: Right;   KNEE SURGERY Right 2009, 2010   partial right knee replacement   VASECTOMY  1990   Patient Active Problem List   Diagnosis Date Noted   Diverticulosis of colon without hemorrhage 06/21/2024   Obesity (BMI 30-39.9) 04/06/2024   Cellulitis, leg 04/05/2024   Elevated coronary artery calcium  score 05/31/2023   Cellulitis, wound, post-operative 04/07/2022   Neurofibroma of foot s/p excision 03/26/22 04/07/2022   Mass of  ankle, right s/p excision on 03/26/22 03/29/2022   Lipoma of right lower extremity    Nonexudative age-related macular degeneration, bilateral, early dry stage 09/02/2021   Nuclear sclerosis of both eyes 09/02/2021   Macular drusen, bilateral 07/26/2018   Dyslipidemia 07/23/2013   HTN (hypertension) 07/23/2013   Obesity (BMI 30.0-34.9) 07/23/2013   Benign prostate hyperplasia 05/01/2012   Elevated prostate specific antigen (PSA) 05/01/2012   Blepharitis 03/29/2012   Nuclear cataract 03/29/2012   Lateral epicondylitis 05/13/2008   PRIMARY LOCALIZED OSTEOARTHROSIS LOWER LEG 08/14/2007   DEGENERATIVE JOINT DISEASE, KNEE 08/14/2007    PCP: Marvine Rush, MD   REFERRING PROVIDER: Margrette Taft BRAVO, MD   REFERRING DIAG: Pain in right hip, Femoroacetabular impingement of right hip   THERAPY DIAG:  Pain in right hip  Rationale for Evaluation and Treatment: Rehabilitation  ONSET DATE: 2 months  SUBJECTIVE:   SUBJECTIVE STATEMENT: Patient reports that he has been having pain in his right hip about 2 months ago. He has also had partial knee replacements on both knees and surgery on his right foot. His right leg got infected back in July 2025 when he had to be hospitalized for one night. Since then he has been having intermittent swelling in his right leg. However, it has not bothered him too much in the past few weeks. He has an elective  surgery later this week and he is unsure when he will be cleared by the surgeon.   PERTINENT HISTORY: HTN and OA PAIN:  Are you having pain? Yes: NPRS scale: Current: 1/10 Worst: 4/10 Pain location: right hip  Pain description: ache Aggravating factors: cold weather, first moving in the morning, putting pressure on his right leg  Relieving factors: movement  PRECAUTIONS: None  RED FLAGS: None   WEIGHT BEARING RESTRICTIONS: No  FALLS:  Has patient fallen in last 6 months? No  LIVING ENVIRONMENT: Lives with: lives with their spouse Lives  in: House/apartment Has following equipment at home: None  OCCUPATION: retired  PLOF: Independent  PATIENT GOALS: be able to walk more  NEXT MD VISIT: 11/22/24  OBJECTIVE:  Note: Objective measures were completed at Evaluation unless otherwise noted.  DIAGNOSTIC FINDINGS: 08/24/24 right hip x-ray  Impression FAI bone changes  PATIENT SURVEYS:  LEFS  Extreme difficulty/unable (0), Quite a bit of difficulty (1), Moderate difficulty (2), Little difficulty (3), No difficulty (4) Survey date:  10/01/24  Any of your usual work, housework or school activities 4  2. Usual hobbies, recreational or sporting activities 4  3. Getting into/out of the bath 4  4. Walking between rooms 4  5. Putting on socks/shoes 4  6. Squatting  4  7. Lifting an object, like a bag of groceries from the floor 4  8. Performing light activities around your home 4  9. Performing heavy activities around your home 4  10. Getting into/out of a car 4  11. Walking 2 blocks 4  12. Walking 1 mile 3  13. Going up/down 10 stairs (1 flight) 4  14. Standing for 1 hour 4  15.  sitting for 1 hour 4  16. Running on even ground 0  17. Running on uneven ground 0  18. Making sharp turns while running fast 0  19. Hopping  0  20. Rolling over in bed 4  Score total:  63/80     COGNITION: Overall cognitive status: Within functional limits for tasks assessed     SENSATION: Patient reports intermittent tingling in his right leg since infection in July 2025.  EDEMA:  No edema observed   PALPATION: TTP: right TFL   LOWER EXTREMITY ROM: no PROM limitations  Active ROM Right eval Left eval  Hip flexion 105 105  Hip extension    Hip abduction    Hip adduction    Hip internal rotation    Hip external rotation    Knee flexion    Knee extension    Ankle dorsiflexion    Ankle plantarflexion    Ankle inversion    Ankle eversion     (Blank rows = not tested)  LOWER EXTREMITY MMT:  MMT Right eval Left eval   Hip flexion 4/5 4/5  Hip extension    Hip abduction (assessed in supine) 5/5; familiar pressure 5/5  Hip adduction 5/5 5/5  Hip internal rotation    Hip external rotation    Knee flexion 5/5 5/5  Knee extension 4+/5 4+/5  Ankle dorsiflexion    Ankle plantarflexion    Ankle inversion    Ankle eversion     (Blank rows = not tested)  LOWER EXTREMITY SPECIAL TESTS:  Hip special tests: Hip scouring test: negative  GAIT: Distance walked: 50 feet  Assistive device utilized: None Level of assistance: Complete Independence Comments: no significant gait deviations observed  TREATMENT DATE:   10/01/24: PT evaluation and patient education  PATIENT EDUCATION:  Education details: OA, POC, prognosis, healing, benefits of exercise, and anatomy Person educated: Patient Education method: Explanation Education comprehension: verbalized understanding  HOME EXERCISE PROGRAM: To be provided at first follow up   ASSESSMENT:  CLINICAL IMPRESSION: Patient is a 69 y.o. male who was seen today for physical therapy evaluation and treatment for right hip pain. He presented with low pain severity and irritability with hip abduction manual muscle testing reproducing his familiar symptoms. Recommend that he continue with skilled physical therapy to address his impairments to return to his prior level of function.     OBJECTIVE IMPAIRMENTS: decreased strength and pain.   ACTIVITY LIMITATIONS: transfers and locomotion level  PARTICIPATION LIMITATIONS: community activity  PERSONAL FACTORS: 1-2 comorbidities: OA and HTN are also affecting patient's functional outcome.   REHAB POTENTIAL: Excellent  CLINICAL DECISION MAKING: Stable/uncomplicated  EVALUATION COMPLEXITY: Low   GOALS: Goals reviewed with patient? No  LONG TERM GOALS: Target date: 10/29/24  Patient will be  independent with his HEP. Baseline:  Goal status: INITIAL  2.  Patient will improve his bilateral hip flexor strength to at least 4+/5 for improved functional mobility. Baseline:  Goal status: INITIAL  3.  Patient will be able to complete his daily activities without his familiar pain exceeding 2/10. Baseline:  Goal status: INITIAL   PLAN:  PT FREQUENCY: 1x/week  PT DURATION: 4 weeks; patient will not be able to return to physical therapy for approximately 2 weeks after initial evaluation secondary to elective surgery  PLANNED INTERVENTIONS: 97164- PT Re-evaluation, 97750- Physical Performance Testing, 97110-Therapeutic exercises, 97530- Therapeutic activity, 97112- Neuromuscular re-education, 97535- Self Care, 02859- Manual therapy, 20560 (1-2 muscles), 20561 (3+ muscles)- Dry Needling, Patient/Family education, Balance training, Joint mobilization, Cryotherapy, and Moist heat  PLAN FOR NEXT SESSION: provide HEP and lower extremity strengthening   Lacinda JAYSON Fass, PT 10/01/2024, 12:14 PM   [employer] Medicare Auth Request Information Treatment Start Date: 10/01/24  Date of referral: 08/24/24 Referring provider: Margrette Taft BRAVO, MD  Referring diagnosis (ICD 10)? M25.551, M25.851  Treatment diagnosis (ICD 10)? (if different than referring diagnosis) M25.551   What was this (referring dx) caused by? Ongoing Issue  Lysle of Condition: Initial Onset (within last 3 months)   Laterality: Rt  Current Functional Measure Score: LEFS 63/80  Objective measurements identify impairments when they are compared to normal values, the uninvolved extremity, and prior level of function.  [x]  Yes  []  No  Objective assessment of functional ability: Minimal functional limitations   Briefly describe symptoms: Ache in the right hip  How did symptoms start: no known cause  Average pain intensity:  Last 24 hours: 1/10  Past week: 1/10  How often does the pt experience symptoms?  Intermittently  How much have the symptoms interfered with usual daily activities? A little bit  How has condition changed since care began at this facility? NA - initial visit  In general, how is the patients overall health? Very Good   BACK PAIN (STarT Back Screening Tool) No  "

## 2024-10-03 ENCOUNTER — Other Ambulatory Visit: Payer: Self-pay | Admitting: Surgery

## 2024-10-04 LAB — SURGICAL PATHOLOGY

## 2024-11-22 ENCOUNTER — Ambulatory Visit: Admitting: Orthopedic Surgery
